# Patient Record
Sex: Male | Born: 1985 | Race: White | Hispanic: No | Marital: Married | Smoking: Never smoker
Health system: Southern US, Community
[De-identification: ages and names within clinical notes are randomized; demographics above are authoritative.]

## PROBLEM LIST (undated history)

## (undated) DIAGNOSIS — F32A Depression, unspecified: Secondary | ICD-10-CM

## (undated) DIAGNOSIS — J45909 Unspecified asthma, uncomplicated: Secondary | ICD-10-CM

## (undated) DIAGNOSIS — F329 Major depressive disorder, single episode, unspecified: Secondary | ICD-10-CM

## (undated) HISTORY — PX: OTHER SURGICAL HISTORY: SHX169

---

## 2018-05-30 ENCOUNTER — Other Ambulatory Visit: Payer: Self-pay

## 2018-05-30 DIAGNOSIS — Z0000 Encounter for general adult medical examination without abnormal findings: Secondary | ICD-10-CM

## 2018-05-31 LAB — URINALYSIS, ROUTINE W REFLEX MICROSCOPIC
Bilirubin, UA: NEGATIVE
GLUCOSE, UA: NEGATIVE
Ketones, UA: NEGATIVE
LEUKOCYTES UA: NEGATIVE
Nitrite, UA: NEGATIVE
PROTEIN UA: NEGATIVE
RBC, UA: NEGATIVE
Specific Gravity, UA: 1.027 (ref 1.005–1.030)
Urobilinogen, Ur: 0.2 mg/dL (ref 0.2–1.0)
pH, UA: 5.5 (ref 5.0–7.5)

## 2018-05-31 LAB — CBC WITH DIFFERENTIAL/PLATELET
BASOS: 1 %
Basophils Absolute: 0 10*3/uL (ref 0.0–0.2)
EOS (ABSOLUTE): 0.2 10*3/uL (ref 0.0–0.4)
EOS: 3 %
HEMATOCRIT: 43.3 % (ref 37.5–51.0)
HEMOGLOBIN: 15.3 g/dL (ref 13.0–17.7)
IMMATURE GRANS (ABS): 0 10*3/uL (ref 0.0–0.1)
IMMATURE GRANULOCYTES: 0 %
LYMPHS: 29 %
Lymphocytes Absolute: 1.6 10*3/uL (ref 0.7–3.1)
MCH: 31.2 pg (ref 26.6–33.0)
MCHC: 35.3 g/dL (ref 31.5–35.7)
MCV: 88 fL (ref 79–97)
MONOCYTES: 11 %
Monocytes Absolute: 0.6 10*3/uL (ref 0.1–0.9)
NEUTROS PCT: 56 %
Neutrophils Absolute: 3.2 10*3/uL (ref 1.4–7.0)
PLATELETS: 273 10*3/uL (ref 150–450)
RBC: 4.9 x10E6/uL (ref 4.14–5.80)
RDW: 12.7 % (ref 12.3–15.4)
WBC: 5.6 10*3/uL (ref 3.4–10.8)

## 2018-05-31 LAB — COMPREHENSIVE METABOLIC PANEL
A/G RATIO: 2.2 (ref 1.2–2.2)
ALBUMIN: 4.9 g/dL (ref 3.5–5.5)
ALT: 18 IU/L (ref 0–44)
AST: 17 IU/L (ref 0–40)
Alkaline Phosphatase: 46 IU/L (ref 39–117)
BUN / CREAT RATIO: 15 (ref 9–20)
BUN: 16 mg/dL (ref 6–20)
Bilirubin Total: 1.1 mg/dL (ref 0.0–1.2)
CHLORIDE: 99 mmol/L (ref 96–106)
CO2: 24 mmol/L (ref 20–29)
Calcium: 9.6 mg/dL (ref 8.7–10.2)
Creatinine, Ser: 1.05 mg/dL (ref 0.76–1.27)
GFR calc non Af Amer: 94 mL/min/{1.73_m2} (ref >59)
GFR, EST AFRICAN AMERICAN: 109 mL/min/{1.73_m2} (ref >59)
GLOBULIN, TOTAL: 2.2 g/dL (ref 1.5–4.5)
GLUCOSE: 98 mg/dL (ref 65–99)
Potassium: 4.3 mmol/L (ref 3.5–5.2)
Sodium: 139 mmol/L (ref 134–144)
TOTAL PROTEIN: 7.1 g/dL (ref 6.0–8.5)

## 2018-05-31 LAB — LIPID PANEL
Chol/HDL Ratio: 3.2 ratio (ref 0.0–5.0)
Cholesterol, Total: 172 mg/dL (ref 100–199)
HDL: 53 mg/dL (ref >39)
LDL Calculated: 95 mg/dL (ref 0–99)
Triglycerides: 120 mg/dL (ref 0–149)
VLDL CHOLESTEROL CAL: 24 mg/dL (ref 5–40)

## 2018-05-31 LAB — PSA: Prostate Specific Ag, Serum: 0.5 ng/mL (ref 0.0–4.0)

## 2018-06-22 ENCOUNTER — Encounter: Payer: Self-pay | Admitting: Adult Health

## 2018-06-22 ENCOUNTER — Ambulatory Visit: Payer: Self-pay | Admitting: Adult Health

## 2018-06-22 ENCOUNTER — Ambulatory Visit
Admission: RE | Admit: 2018-06-22 | Discharge: 2018-06-22 | Disposition: A | Discharge status: Home / Self Care | Payer: BLUE CROSS/BLUE SHIELD | Source: Ambulatory Visit | Attending: Adult Health | Admitting: Adult Health

## 2018-06-22 VITALS — BP 119/72 | HR 62 | Temp 98.4°F | Resp 16 | Ht 67.0 in | Wt 173.0 lb

## 2018-06-22 DIAGNOSIS — R509 Fever, unspecified: Secondary | ICD-10-CM | POA: Insufficient documentation

## 2018-06-22 DIAGNOSIS — R062 Wheezing: Secondary | ICD-10-CM

## 2018-06-22 DIAGNOSIS — J069 Acute upper respiratory infection, unspecified: Secondary | ICD-10-CM

## 2018-06-22 DIAGNOSIS — R05 Cough: Secondary | ICD-10-CM | POA: Insufficient documentation

## 2018-06-22 MED ORDER — ALBUTEROL SULFATE HFA 108 (90 BASE) MCG/ACT IN AERS: 2.0000 | INHALATION_SPRAY | Freq: Four times a day (QID) | RESPIRATORY_TRACT | 2 refills | Status: DC | PRN

## 2018-06-22 MED ORDER — BENZONATATE 100 MG PO CAPS: 100.0000 mg | ORAL_CAPSULE | Freq: Two times a day (BID) | ORAL | 0 refills | Status: DC | PRN

## 2018-06-22 MED ORDER — IPRATROPIUM-ALBUTEROL 0.5-2.5 (3) MG/3ML IN SOLN
3.0000 mL | Freq: Once | RESPIRATORY_TRACT | Status: AC
  Administered 2018-06-21: 3 mL via RESPIRATORY_TRACT

## 2018-06-22 MED ORDER — AMOXICILLIN-POT CLAVULANATE 875-125 MG PO TABS: 1.0000 | ORAL_TABLET | Freq: Two times a day (BID) | ORAL | 0 refills | Status: DC

## 2018-06-22 MED ORDER — PREDNISONE 10 MG (21) PO TBPK: ORAL_TABLET | ORAL | 0 refills | Status: DC

## 2018-06-22 NOTE — Progress Notes (Signed)
Subjective:     Patient ID: Jerry Santana, male   DOB: July 18, 1986, 32 y.o.   MRN: [id number]  Blood pressure 119/72, pulse 62, temperature 98.4 F (36.9 C), resp. rate 16, height 5\' 7"  (1.702 m), weight 173 lb (78.5 kg), SpO2 97 %.; Pulse ox 99 % after Duo- neb treatment.   HPI  Patient is a 32 year old male in no acute distress who comes to the clinic for cough, cold for 1.5 weeks.  He reports he was feeling better initially but started feeling worse. He reports past three nights fever, chills. Denies fever and chills last night. Mild body aches.  Reports non productive cough.  He reports he is still having "rattling"  in his chest and cough.  He has still been working still. Mild to moderate fatigue.  Denies any abdominal pain.   His father was sick with something similar about 2 weeks ago. Wife with cold.   80 month old  son in daycare has been sick on and off.   NKDA   Denies ever smoking, Denies vaping.  Patient  denies  rash, chest pain, shortness of breath, nausea, vomiting, or diarrhea.  History of syncope  6/37/19 - denies any since - was seen by cardiac - all was negative per patient.  Denies any palpitations.    Review of Systems  Constitutional: Positive for appetite change (decreased ), chills, fatigue and fever. Negative for activity change (still working and excercising " feels tired " ), diaphoresis and unexpected weight change.  HENT: Positive for congestion, postnasal drip, rhinorrhea and sore throat. Negative for dental problem, ear discharge, sinus pressure, sinus pain, sneezing, tinnitus, trouble swallowing and voice change.   Eyes: Negative.   Respiratory: Positive for cough, chest tightness and wheezing. Negative for apnea, choking, shortness of breath and stridor.   Cardiovascular: Negative for chest pain, palpitations and leg swelling.  Gastrointestinal: Negative.   Genitourinary: Negative.   Musculoskeletal: Positive for myalgias. Negative for  arthralgias, back pain, gait problem, joint swelling, neck pain and neck stiffness.  Skin: Negative.   Allergic/Immunologic: Negative.   Neurological: Negative.   Hematological: Negative.   Psychiatric/Behavioral: Negative.        Objective:   Physical Exam  Constitutional: He is oriented to person, place, and time. He appears well-developed and well-nourished. He is active.  Non-toxic appearance. He does not have a sickly appearance. No distress.  Patient is alert and oriented and responsive to questions Engages in eye contact with provider. Speaks in full sentences without any pauses without any shortness of breath or distress.  Pale skin   HENT:  Head: Normocephalic and atraumatic.  Right Ear: External ear normal. Tympanic membrane is not perforated and not erythematous. A middle ear effusion is present.  Left Ear: External ear normal. Tympanic membrane is not perforated and not erythematous. A middle ear effusion is present.  Nose: Mucosal edema and rhinorrhea present. Right sinus exhibits no maxillary sinus tenderness and no frontal sinus tenderness. Left sinus exhibits no maxillary sinus tenderness and no frontal sinus tenderness.  Mouth/Throat: Uvula is midline and mucous membranes are normal. Posterior oropharyngeal erythema present. No oropharyngeal exudate, posterior oropharyngeal edema or tonsillar abscesses. Tonsils are 1+ on the right. Tonsils are 1+ on the left. No tonsillar exudate.  Eyes: Pupils are equal, round, and reactive to light. Conjunctivae, EOM and lids are normal. Right eye exhibits no discharge. Left eye exhibits no discharge.  Neck: Normal range of motion. Neck supple. No JVD present. No  tracheal deviation present.  Cardiovascular: Normal rate, regular rhythm, normal heart sounds and intact distal pulses. Exam reveals no gallop and no friction rub.  No murmur heard. Pulmonary/Chest: Effort normal. No accessory muscle usage or stridor. No apnea, no tachypnea and no  bradypnea. No respiratory distress. He has no decreased breath sounds. He has wheezes in the right upper field and the left upper field. He has rhonchi in the right upper field and the left upper field. He has rales (scattered crackles ) in the left upper field and the left middle field.  Duoneb breathing treatment  administered by Armando Gangracy Greene RN - breath sounds improved after treatment, no rales heard and scattered  rhonchi much decreased and only mild expiratory wheeze after treatment.   Abdominal: Soft. Normal appearance and bowel sounds are normal. He exhibits no distension and no mass. There is no tenderness. There is no rebound and no guarding. No hernia.  Musculoskeletal: Normal range of motion. He exhibits no edema or tenderness.  Patient moves on and off of exam table and in room without difficulty. Gait is normal in hall and in room. Patient is oriented to person place time and situation. Patient answers questions appropriately and engages in conversation.   Lymphadenopathy:       Head (right side): No submental, no submandibular, no tonsillar, no preauricular, no posterior auricular and no occipital adenopathy present.       Head (left side): No submental, no submandibular, no tonsillar, no preauricular, no posterior auricular and no occipital adenopathy present.    He has no cervical adenopathy.  Neurological: He is alert and oriented to person, place, and time.  Skin: Skin is warm. Capillary refill takes less than 2 seconds. He is not diaphoretic. No erythema. No pallor.  Vitals reviewed.      Assessment:       Upper respiratory tract infection, unspecified type  Wheezing - Plan: ipratropium-albuterol (DUONEB) 0.5-2.5 (3) MG/3ML nebulizer solution 3 mL, DG Chest 2 View   Plan:        Meds ordered this encounter  Medications  . ipratropium-albuterol (DUONEB) 0.5-2.5 (3) MG/3ML nebulizer solution 3 mL  . albuterol (PROVENTIL HFA;VENTOLIN HFA) 108 (90 Base) MCG/ACT inhaler     Sig: Inhale 2 puffs into the lungs every 6 (six) hours as needed for wheezing or shortness of breath.    Dispense:  1 Inhaler    Refill:  2  . predniSONE (STERAPRED UNI-PAK 21 TAB) 10 MG (21) TBPK tablet    Sig: PO: Take 6 tablets on day 1:Take 5 tablets day 2:Take 4 tablets day 3: Take 3 tablets day 4:Take 2 tablets day five: 5 Take 1 tablet day 6    Dispense:  21 tablet    Refill:  0  . amoxicillin-clavulanate (AUGMENTIN) 875-125 MG tablet    Sig: Take 1 tablet by mouth 2 (two) times daily.    Dispense:  20 tablet    Refill:  0  . benzonatate (TESSALON) 100 MG capsule    Sig: Take 1 capsule (100 mg total) by mouth 2 (two) times daily as needed for cough. May cause drowsiness    Dispense:  20 capsule    Refill:  0   Reviewed After visit summary with patient. Reviewed if any respiratory distress Call 911 if symptoms worsen at anytime seek medical attention immediately.  Explained inhaler and use.  Would like chest x ray rule out pneumonia versus bronchitis- patient will go - will call if abnormal.  Patient  questions if he can still continue playing sports and running advised to rest and not to participate until one week recheck. Offered work note- patient declined.   Return in about 1 week (around 06/29/2018) for at any time for any worsening symptoms, Call 911 for emergencies.  Orders Placed This Encounter  Procedures  . DG Chest 2 View    Standing Status:   Future    Standing Expiration Date:   07/22/2018    Order Specific Question:   Reason for Exam (SYMPTOM  OR DIAGNOSIS REQUIRED)    Answer:   wheezing, rhonchi with scattered crackles /  left upper/ midddle lobe rule out pneumonia other etiology. cough fever chills.    Order Specific Question:   Preferred imaging location?    Answer:   [facility]    Order Specific Question:   Call Results- Best Contact Number?    Answer:   [phone]    Order Specific Question:   Radiology Contrast Protocol - do NOT remove file path     Answer:   [email or web address]     Advised patient call the office or your primary care doctor for an appointment if no improvement within 72 hours or if any symptoms change or worsen at any time  Advised ER or urgent Care if after hours or on weekend. Call 911 for emergency symptoms at any time.Patinet verbalized understanding of all instructions given/reviewed and treatment plan and has no further questions or concerns at this time.    Patient verbalized understanding of all instructions given and denies any further questions at this time.

## 2018-06-22 NOTE — Patient Instructions (Addendum)
Upper Respiratory Infection, Adult Most upper respiratory infections (URIs) are caused by a virus. A URI affects the nose, throat, and upper air passages. The most common type of URI is often called "the common cold." Follow these instructions at home:  Take medicines only as told by your doctor.  Gargle warm saltwater or take cough drops to comfort your throat as told by your doctor.  Use a warm mist humidifier or inhale steam from a shower to increase air moisture. This may make it easier to breathe.  Drink enough fluid to keep your pee (urine) clear or pale yellow.  Eat soups and other clear broths.  Have a healthy diet.  Rest as needed.  Go back to work when your fever is gone or your doctor says it is okay. ? You may need to stay home longer to avoid giving your URI to others. ? You can also wear a face mask and wash your hands often to prevent spread of the virus.  Use your inhaler more if you have asthma.  Do not use any tobacco products, including cigarettes, chewing tobacco, or electronic cigarettes. If you need help quitting, ask your doctor. Contact a doctor if:  You are getting worse, not better.  Your symptoms are not helped by medicine.  You have chills.  You are getting more short of breath.  You have brown or red mucus.  You have yellow or brown discharge from your nose.  You have pain in your face, especially when you bend forward.  You have a fever.  You have puffy (swollen) neck glands.  You have pain while swallowing.  You have white areas in the back of your throat. Get help right away if:  You have very bad or constant: ? Headache. ? Ear pain. ? Pain in your forehead, behind your eyes, and over your cheekbones (sinus pain). ? Chest pain.  You have long-lasting (chronic) lung disease and any of the following: ? Wheezing. ? Long-lasting cough. ? Coughing up blood. ? A change in your usual mucus.  You have a stiff neck.  You have  changes in your: ? Vision. ? Hearing. ? Thinking. ? Mood. This information is not intended to replace advice given to you by your health care provider. Make sure you discuss any questions you have with your health care provider. Document Released: 01/05/2008 Document Revised: 03/21/2016 Document Reviewed: 10/24/2013 Elsevier Interactive Patient Education  2018 [employer]. Bronchospasm, Adult Bronchospasm is a tightening of the airways going into the lungs. During an episode, it may be harder to breathe. You may cough, and you may make a whistling sound when you breathe (wheeze). This condition often affects people with asthma. What are the causes? This condition is caused by swelling and irritation in the airways. It can be triggered by:  An infection (common).  Seasonal allergies.  An allergic reaction.  Exercise.  Irritants. These include pollution, cigarette smoke, strong odors, aerosol sprays, and paint fumes.  Weather changes. Winds increase molds and pollens in the air. Cold air may cause swelling.  Stress and emotional upset.  What are the signs or symptoms? Symptoms of this condition include:  Wheezing. If the episode was triggered by an allergy, wheezing may start right away or hours later.  Nighttime coughing.  Frequent or severe coughing with a simple cold.  Chest tightness.  Shortness of breath.  Decreased ability to exercise.  How is this diagnosed? This condition is usually diagnosed with a review of your medical  history and a physical exam. Tests, such as lung function tests, are sometimes done to look for other conditions. The need for a chest X-ray depends on where the wheezing occurs and whether it is the first time you have wheezed. How is this treated? This condition may be treated with:  Inhaled medicines. These open up the airways and help you breathe. They can be taken with an inhaler or a nebulizer device.  Corticosteroid medicines. These  may be given for severe bronchospasm, usually when it is associated with asthma.  Avoiding triggers, such as irritants, infection, or allergies.  Follow these instructions at home: Medicines  Take over-the-counter and prescription medicines only as told by your health care provider.  If you need to use an inhaler or nebulizer to take your medicine, ask your health care provider to explain how to use it correctly. If you were given a spacer, always use it with your inhaler. Lifestyle  Reduce the number of triggers in your home. To do this: ? Change your heating and air conditioning filter at least once a month. ? Limit your use of fireplaces and wood stoves. ? Do not smoke. Do not allow smoking in your home. ? Avoid using perfumes and fragrances. ? Get rid of pests, such as roaches and mice, and their droppings. ? Remove any mold from your home. ? Keep your house clean and dust free. Use unscented cleaning products. ? Replace carpet with wood, tile, or vinyl flooring. Carpet can trap dander and dust. ? Use allergy-proof pillows, mattress covers, and box spring covers. ? Wash bed sheets and blankets every week in hot water. Dry them in a dryer. ? Use blankets that are made of polyester or cotton. ? Wash your hands often. ? Do not allow pets in your bedroom.  Avoid breathing in cold air when you exercise. General instructions  Have a plan for seeking medical care. Know when to call your health care provider and local emergency services, and where to get emergency care.  Stay up to date on your immunizations.  When you have an episode of bronchospasm, stay calm. Try to relax and breathe more slowly.  If you have asthma, make sure you have an asthma action plan.  Keep all follow-up visits as told by your health care provider. This is important. Contact a health care provider if:  You have muscle aches.  You have chest pain.  The mucus that you cough up (sputum) changes from  clear or white to yellow, green, gray, or bloody.  You have a fever.  Your sputum gets thicker. Get help right away if:  Your wheezing and coughing get worse, even after you take your prescribed medicines.  It gets even harder to breathe.  You develop severe chest pain. Summary  Bronchospasm is a tightening of the airways going into the lungs.  During an episode of bronchospasm, you may have a harder time breathing. You may cough and make a whistling sound when you breathe (wheeze).  Avoid exposure to triggers such as smoke, dust, mold, animal dander, and fragrances.  When you have an episode of bronchospasm, stay calm. Try to relax and breathe more slowly. This information is not intended to replace advice given to you by your health care provider. Make sure you discuss any questions you have with your health care provider. Document Released: 07/22/2003 Document Revised: 07/15/2016 Document Reviewed: 07/15/2016 Elsevier Interactive Patient Education  2017 [employer] Albuterol inhalation aerosol What is this medicine? ALBUTEROL (al  Gaspar Bidding) is a bronchodilator. It helps open up the airways in your lungs to make it easier to breathe. This medicine is used to treat and to prevent bronchospasm. This medicine may be used for other purposes; ask your health care provider or pharmacist if you have questions. COMMON BRAND NAME(S): Proair HFA, Proventil, Proventil HFA, Respirol, Ventolin, Ventolin HFA What should I tell my health care provider before I take this medicine? They need to know if you have any of the following conditions: -diabetes -heart disease or irregular heartbeat -high blood pressure -pheochromocytoma -seizures -thyroid disease -an unusual or allergic reaction to albuterol, levalbuterol, sulfites, other medicines, foods, dyes, or preservatives -pregnant or trying to get pregnant -breast-feeding How should I use this medicine? This medicine is for inhalation  through the mouth. Follow the directions on your prescription label. Take your medicine at regular intervals. Do not use more often than directed. Make sure that you are using your inhaler correctly. Ask you doctor or health care provider if you have any questions. Talk to your pediatrician regarding the use of this medicine in children. Special care may be needed. Overdosage: If you think you have taken too much of this medicine contact a poison control center or emergency room at once. NOTE: This medicine is only for you. Do not share this medicine with others. What if I miss a dose? If you miss a dose, use it as soon as you can. If it is almost time for your next dose, use only that dose. Do not use double or extra doses. What may interact with this medicine? -anti-infectives like chloroquine and pentamidine -caffeine -cisapride -diuretics -medicines for colds -medicines for depression or for emotional or psychotic conditions -medicines for weight loss including some herbal products -methadone -some antibiotics like clarithromycin, erythromycin, levofloxacin, and linezolid -some heart medicines -steroid hormones like dexamethasone, cortisone, hydrocortisone -theophylline -thyroid hormones This list may not describe all possible interactions. Give your health care provider a list of all the medicines, herbs, non-prescription drugs, or dietary supplements you use. Also tell them if you smoke, drink alcohol, or use illegal drugs. Some items may interact with your medicine. What should I watch for while using this medicine? Tell your doctor or health care professional if your symptoms do not improve. Do not use extra albuterol. If your asthma or bronchitis gets worse while you are using this medicine, call your doctor right away. If your mouth gets dry try chewing sugarless gum or sucking hard candy. Drink water as directed. What side effects may I notice from receiving this medicine? Side  effects that you should report to your doctor or health care professional as soon as possible: -allergic reactions like skin rash, itching or hives, swelling of the face, lips, or tongue -breathing problems -chest pain -feeling faint or lightheaded, falls -high blood pressure -irregular heartbeat -fever -muscle cramps or weakness -pain, tingling, numbness in the hands or feet -vomiting Side effects that usually do not require medical attention (report to your doctor or health care professional if they continue or are bothersome): -cough -difficulty sleeping -headache -nervousness or trembling -stomach upset -stuffy or runny nose -throat irritation -unusual taste This list may not describe all possible side effects. Call your doctor for medical advice about side effects. You may report side effects to FDA at 1-800-FDA-1088. Where should I keep my medicine? Keep out of the reach of children. Store at room temperature between 15 and 30 degrees C (59 and 86 degrees F). The contents  are under pressure and may burst when exposed to heat or flame. Do not freeze. This medicine does not work as well if it is too cold. Throw away any unused medicine after the expiration date. Inhalers need to be thrown away after the labeled number of puffs have been used or by the expiration date; whichever comes first. Ventolin HFA should be thrown away 12 months after removing from foil pouch. Check the instructions that come with your medicine. NOTE: This sheet is a summary. It may not cover all possible information. If you have questions about this medicine, talk to your doctor, pharmacist, or health care provider.  2018 Elsevier/Gold Standard (2013-01-04 10:57:17) Prednisolone tablets What is this medicine? PREDNISOLONE (pred NISS oh lone) is a corticosteroid. It is commonly used to treat inflammation of the skin, joints, lungs, and other organs. Common conditions treated include asthma, allergies, and  arthritis. It is also used for other conditions, such as blood disorders and diseases of the adrenal glands. This medicine may be used for other purposes; ask your health care provider or pharmacist if you have questions. COMMON BRAND NAME(S): Millipred, Millipred DP, Millipred DP 12-Day, Millipred DP 6 Day, Prednoral What should I tell my health care provider before I take this medicine? They need to know if you have any of these conditions: -Cushing's syndrome -diabetes -glaucoma -heart problems or disease -high blood pressure -infection such as herpes, measles, tuberculosis, or chickenpox -kidney disease -liver disease -mental problems -myasthenia gravis -osteoporosis -seizures -stomach ulcer or intestine disease including colitis and diverticulitis -thyroid problem -an unusual or allergic reaction to lactose, prednisolone, other medicines, foods, dyes, or preservatives -pregnant or trying to get pregnant -breast-feeding How should I use this medicine? Take this medicine by mouth with a glass of water. Follow the directions on the prescription label. Take it with food or milk to avoid stomach upset. If you are taking this medicine once a day, take it in the morning. Do not take more medicine than you are told to take. Do not suddenly stop taking your medicine because you may develop a severe reaction. Your doctor will tell you how much medicine to take. If your doctor wants you to stop the medicine, the dose may be slowly lowered over time to avoid any side effects. Talk to your pediatrician regarding the use of this medicine in children. Special care may be needed. Overdosage: If you think you have taken too much of this medicine contact a poison control center or emergency room at once. NOTE: This medicine is only for you. Do not share this medicine with others. What if I miss a dose? If you miss a dose, take it as soon as you can. If it is almost time for your next dose, take only  that dose. Do not take double or extra doses. What may interact with this medicine? Do not take this medicine with any of the following medications: -metyrapone -mifepristone This medicine may also interact with the following medications: -aminoglutethimide -amphotericin B -aspirin and aspirin-like medicines -barbiturates -certain medicines for diabetes, like glipizide or glyburide -cholestyramine -cholinesterase inhibitors -cyclosporine -digoxin -diuretics -ephedrine -male hormones, like estrogens and birth control pills -isoniazid -ketoconazole -NSAIDS, medicines for pain and inflammation, like ibuprofen or naproxen -phenytoin -rifampin -toxoids -vaccines -warfarin This list may not describe all possible interactions. Give your health care provider a list of all the medicines, herbs, non-prescription drugs, or dietary supplements you use. Also tell them if you smoke, drink alcohol, or use illegal drugs. Some  items may interact with your medicine. What should I watch for while using this medicine? Visit your doctor or health care professional for regular checks on your progress. If you are taking this medicine over a prolonged period, carry an identification card with your name and address, the type and dose of your medicine, and your doctor's name and address. This medicine may increase your risk of getting an infection. Tell your doctor or health care professional if you are around anyone with measles or chickenpox, or if you develop sores or blisters that do not heal properly. If you are going to have surgery, tell your doctor or health care professional that you have taken this medicine within the last twelve months. Ask your doctor or health care professional about your diet. You may need to lower the amount of salt you eat. This medicine may affect blood sugar levels. If you have diabetes, check with your doctor or health care professional before you change your diet or the  dose of your diabetic medicine. What side effects may I notice from receiving this medicine? Side effects that you should report to your doctor or health care professional as soon as possible: -allergic reactions like skin rash, itching or hives, swelling of the face, lips, or tongue -changes in emotions or moods -changes in vision -eye pain -signs and symptoms of high blood sugar such as dizziness; dry mouth; dry skin; fruity breath; nausea; stomach pain; increased hunger or thirst; increased urination -signs and symptoms of infection like fever or chills; cough; sore throat; pain or trouble passing urine -slow growth in children (if used for longer periods of time) -swelling of ankles, feet -trouble sleeping -unusually weak or tired -weak bones (if used for longer periods of time) Side effects that usually do not require medical attention (report to your doctor or health care professional if they continue or are bothersome): -increased hunger -nausea -skin problems, acne, thin and shiny skin -upset stomach -weight gain This list may not describe all possible side effects. Call your doctor for medical advice about side effects. You may report side effects to FDA at 1-800-FDA-1088. Where should I keep my medicine? Keep out of the reach of children. Store at room temperature between 15 and 30 degrees C (59 and 86 degrees F). Keep container tightly closed. Throw away any unused medicine after the expiration date. NOTE: This sheet is a summary. It may not cover all possible information. If you have questions about this medicine, talk to your doctor, pharmacist, or health care provider.  2018 Elsevier/Gold Standard (2015-08-21 12:30:30) Amoxicillin; Clavulanic Acid tablets What is this medicine? AMOXICILLIN; CLAVULANIC ACID (a mox i SIL in; KLAV yoo lan ic AS id) is a penicillin antibiotic. It is used to treat certain kinds of bacterial infections. It will not work for colds, flu, or other  viral infections. This medicine may be used for other purposes; ask your health care provider or pharmacist if you have questions. COMMON BRAND NAME(S): Augmentin What should I tell my health care provider before I take this medicine? They need to know if you have any of these conditions: -bowel disease, like colitis -kidney disease -liver disease -mononucleosis -an unusual or allergic reaction to amoxicillin, penicillin, cephalosporin, other antibiotics, clavulanic acid, other medicines, foods, dyes, or preservatives -pregnant or trying to get pregnant -breast-feeding How should I use this medicine? Take this medicine by mouth with a full glass of water. Follow the directions on the prescription label. Take at the start of a  meal. Do not crush or chew. If the tablet has a score line, you may cut it in half at the score line for easier swallowing. Take your medicine at regular intervals. Do not take your medicine more often than directed. Take all of your medicine as directed even if you think you are better. Do not skip doses or stop your medicine early. Talk to your pediatrician regarding the use of this medicine in children. Special care may be needed. Overdosage: If you think you have taken too much of this medicine contact a poison control center or emergency room at once. NOTE: This medicine is only for you. Do not share this medicine with others. What if I miss a dose? If you miss a dose, take it as soon as you can. If it is almost time for your next dose, take only that dose. Do not take double or extra doses. What may interact with this medicine? -allopurinol -anticoagulants -birth control pills -methotrexate -probenecid This list may not describe all possible interactions. Give your health care provider a list of all the medicines, herbs, non-prescription drugs, or dietary supplements you use. Also tell them if you smoke, drink alcohol, or use illegal drugs. Some items may interact  with your medicine. What should I watch for while using this medicine? Tell your doctor or health care professional if your symptoms do not improve. Do not treat diarrhea with over the counter products. Contact your doctor if you have diarrhea that lasts more than 2 days or if it is severe and watery. If you have diabetes, you may get a false-positive result for sugar in your urine. Check with your doctor or health care professional. Birth control pills may not work properly while you are taking this medicine. Talk to your doctor about using an extra method of birth control. What side effects may I notice from receiving this medicine? Side effects that you should report to your doctor or health care professional as soon as possible: -allergic reactions like skin rash, itching or hives, swelling of the face, lips, or tongue -breathing problems -dark urine -fever or chills, sore throat -redness, blistering, peeling or loosening of the skin, including inside the mouth -seizures -trouble passing urine or change in the amount of urine -unusual bleeding, bruising -unusually weak or tired -white patches or sores in the mouth or throat Side effects that usually do not require medical attention (report to your doctor or health care professional if they continue or are bothersome): -diarrhea -dizziness -headache -nausea, vomiting -stomach upset -vaginal or anal irritation This list may not describe all possible side effects. Call your doctor for medical advice about side effects. You may report side effects to FDA at 1-800-FDA-1088. Where should I keep my medicine? Keep out of the reach of children. Store at room temperature below 25 degrees C (77 degrees F). Keep container tightly closed. Throw away any unused medicine after the expiration date. NOTE: This sheet is a summary. It may not cover all possible information. If you have questions about this medicine, talk to your doctor, pharmacist, or  health care provider.  2018 Elsevier/Gold Standard (2007-10-12 12:04:30)

## 2018-09-01 ENCOUNTER — Other Ambulatory Visit: Payer: Self-pay | Admitting: Pulmonary Disease

## 2018-09-01 DIAGNOSIS — R42 Dizziness and giddiness: Secondary | ICD-10-CM

## 2018-09-01 DIAGNOSIS — R0609 Other forms of dyspnea: Principal | ICD-10-CM

## 2018-09-14 ENCOUNTER — Ambulatory Visit: Payer: BLUE CROSS/BLUE SHIELD | Attending: Pulmonary Disease

## 2018-09-21 ENCOUNTER — Ambulatory Visit: Payer: BLUE CROSS/BLUE SHIELD

## 2018-09-26 ENCOUNTER — Ambulatory Visit: Payer: BLUE CROSS/BLUE SHIELD

## 2018-10-10 ENCOUNTER — Ambulatory Visit: Payer: BLUE CROSS/BLUE SHIELD | Attending: Pulmonary Disease

## 2018-10-10 ENCOUNTER — Other Ambulatory Visit: Payer: Self-pay

## 2018-10-10 DIAGNOSIS — R0609 Other forms of dyspnea: Secondary | ICD-10-CM | POA: Diagnosis present

## 2018-10-10 DIAGNOSIS — R42 Dizziness and giddiness: Secondary | ICD-10-CM | POA: Insufficient documentation

## 2018-10-10 MED ORDER — METHACHOLINE 16 MG/ML NEB SOLN
2.0000 mL | Freq: Once | RESPIRATORY_TRACT | Status: AC
  Administered 2018-10-10: 32 mg via RESPIRATORY_TRACT
  Filled 2018-10-10: qty 2

## 2018-10-10 MED ORDER — METHACHOLINE 4 MG/ML NEB SOLN
2.0000 mL | Freq: Once | RESPIRATORY_TRACT | Status: AC
  Administered 2018-10-10: 8 mg via RESPIRATORY_TRACT
  Filled 2018-10-10: qty 2

## 2018-10-10 MED ORDER — METHACHOLINE 0.0625 MG/ML NEB SOLN
2.0000 mL | Freq: Once | RESPIRATORY_TRACT | Status: AC
  Administered 2018-10-10: 0.125 mg via RESPIRATORY_TRACT
  Filled 2018-10-10: qty 2

## 2018-10-10 MED ORDER — METHACHOLINE 0.25 MG/ML NEB SOLN
2.0000 mL | Freq: Once | RESPIRATORY_TRACT | Status: AC
  Administered 2018-10-10: 0.5 mg via RESPIRATORY_TRACT
  Filled 2018-10-10: qty 2

## 2018-10-10 MED ORDER — METHACHOLINE 1 MG/ML NEB SOLN
2.0000 mL | Freq: Once | RESPIRATORY_TRACT | Status: AC
  Administered 2018-10-10: 2 mg via RESPIRATORY_TRACT
  Filled 2018-10-10: qty 2

## 2018-10-10 MED ORDER — SODIUM CHLORIDE 0.9 % IN NEBU
3.0000 mL | INHALATION_SOLUTION | Freq: Once | RESPIRATORY_TRACT | Status: AC
  Administered 2018-10-10: 3 mL via RESPIRATORY_TRACT

## 2018-10-10 MED ORDER — ALBUTEROL SULFATE (2.5 MG/3ML) 0.083% IN NEBU
2.5000 mg | INHALATION_SOLUTION | Freq: Once | RESPIRATORY_TRACT | Status: AC
  Administered 2018-10-10: 2.5 mg via RESPIRATORY_TRACT
  Filled 2018-10-10: qty 3

## 2018-11-28 ENCOUNTER — Emergency Department: Payer: BLUE CROSS/BLUE SHIELD

## 2018-11-28 ENCOUNTER — Encounter: Payer: Self-pay | Admitting: Emergency Medicine

## 2018-11-28 ENCOUNTER — Observation Stay
Admission: EM | Admit: 2018-11-28 | Discharge: 2018-11-29 | Disposition: A | Discharge status: Home / Self Care | Payer: BLUE CROSS/BLUE SHIELD | Attending: Internal Medicine | Admitting: Internal Medicine

## 2018-11-28 ENCOUNTER — Other Ambulatory Visit: Payer: Self-pay

## 2018-11-28 DIAGNOSIS — F329 Major depressive disorder, single episode, unspecified: Secondary | ICD-10-CM | POA: Insufficient documentation

## 2018-11-28 DIAGNOSIS — N179 Acute kidney failure, unspecified: Secondary | ICD-10-CM | POA: Insufficient documentation

## 2018-11-28 DIAGNOSIS — J4520 Mild intermittent asthma, uncomplicated: Secondary | ICD-10-CM | POA: Insufficient documentation

## 2018-11-28 DIAGNOSIS — R55 Syncope and collapse: Principal | ICD-10-CM | POA: Insufficient documentation

## 2018-11-28 DIAGNOSIS — Z7951 Long term (current) use of inhaled steroids: Secondary | ICD-10-CM | POA: Diagnosis not present

## 2018-11-28 DIAGNOSIS — E860 Dehydration: Secondary | ICD-10-CM | POA: Diagnosis not present

## 2018-11-28 DIAGNOSIS — E872 Acidosis: Secondary | ICD-10-CM | POA: Insufficient documentation

## 2018-11-28 DIAGNOSIS — Z79899 Other long term (current) drug therapy: Secondary | ICD-10-CM | POA: Diagnosis not present

## 2018-11-28 DIAGNOSIS — R0602 Shortness of breath: Secondary | ICD-10-CM | POA: Diagnosis present

## 2018-11-28 DIAGNOSIS — E876 Hypokalemia: Secondary | ICD-10-CM | POA: Diagnosis not present

## 2018-11-28 HISTORY — DX: Major depressive disorder, single episode, unspecified: F32.9

## 2018-11-28 HISTORY — DX: Unspecified asthma, uncomplicated: J45.909

## 2018-11-28 HISTORY — DX: Depression, unspecified: F32.A

## 2018-11-28 LAB — ETHANOL: Alcohol, Ethyl (B): <10 mg/dL (ref <10)

## 2018-11-28 LAB — COMPREHENSIVE METABOLIC PANEL
ALT: 22 U/L (ref 0–44)
AST: 24 U/L (ref 15–41)
Albumin: 4.8 g/dL (ref 3.5–5.0)
Alkaline Phosphatase: 42 U/L (ref 38–126)
Anion gap: 14 (ref 5–15)
BUN: 19 mg/dL (ref 6–20)
CO2: 23 mmol/L (ref 22–32)
Calcium: 8.9 mg/dL (ref 8.9–10.3)
Chloride: 102 mmol/L (ref 98–111)
Creatinine, Ser: 1.4 mg/dL — ABNORMAL HIGH (ref 0.61–1.24)
GFR calc Af Amer: >60 mL/min (ref >60)
GFR calc non Af Amer: >60 mL/min (ref >60)
Glucose, Bld: 248 mg/dL — ABNORMAL HIGH (ref 70–99)
Potassium: 3.4 mmol/L — ABNORMAL LOW (ref 3.5–5.1)
Sodium: 139 mmol/L (ref 135–145)
Total Bilirubin: 1.1 mg/dL (ref 0.3–1.2)
Total Protein: 7.1 g/dL (ref 6.5–8.1)

## 2018-11-28 LAB — TROPONIN I: Troponin I: <0.03 ng/mL (ref <0.03)

## 2018-11-28 LAB — CBC WITH DIFFERENTIAL/PLATELET
Abs Immature Granulocytes: 0.08 10*3/uL — ABNORMAL HIGH (ref 0.00–0.07)
Basophils Absolute: 0 10*3/uL (ref 0.0–0.1)
Basophils Relative: 0 %
Eosinophils Absolute: 0 10*3/uL (ref 0.0–0.5)
Eosinophils Relative: 0 %
HCT: 43.6 % (ref 39.0–52.0)
Hemoglobin: 15.1 g/dL (ref 13.0–17.0)
Immature Granulocytes: 1 %
Lymphocytes Relative: 53 %
Lymphs Abs: 4 10*3/uL (ref 0.7–4.0)
MCH: 29.8 pg (ref 26.0–34.0)
MCHC: 34.6 g/dL (ref 30.0–36.0)
MCV: 86.2 fL (ref 80.0–100.0)
Monocytes Absolute: 0.4 10*3/uL (ref 0.1–1.0)
Monocytes Relative: 5 %
Neutro Abs: 3.1 10*3/uL (ref 1.7–7.7)
Neutrophils Relative %: 41 %
Platelets: 281 10*3/uL (ref 150–400)
RBC: 5.06 MIL/uL (ref 4.22–5.81)
RDW: 11.6 % (ref 11.5–15.5)
WBC: 7.6 10*3/uL (ref 4.0–10.5)
nRBC: 0 % (ref 0.0–0.2)

## 2018-11-28 LAB — LACTIC ACID, PLASMA: Lactic Acid, Venous: 2.8 mmol/L (ref 0.5–1.9)

## 2018-11-28 LAB — SALICYLATE LEVEL: Salicylate Lvl: <7 mg/dL (ref 2.8–30.0)

## 2018-11-28 LAB — ACETAMINOPHEN LEVEL: Acetaminophen (Tylenol), Serum: <10 ug/mL — ABNORMAL LOW (ref 10–30)

## 2018-11-28 MED ORDER — ONDANSETRON HCL 4 MG/2ML IJ SOLN
4.0000 mg | Freq: Once | INTRAMUSCULAR | Status: AC
  Administered 2018-11-28: 22:00:00 4 mg via INTRAVENOUS

## 2018-11-28 MED ORDER — SODIUM CHLORIDE 0.9 % IV BOLUS
500.0000 mL | Freq: Once | INTRAVENOUS | Status: AC
  Administered 2018-11-28: 500 mL via INTRAVENOUS

## 2018-11-28 MED ORDER — SODIUM CHLORIDE 0.9 % IV BOLUS
500.0000 mL | Freq: Once | INTRAVENOUS | Status: AC
  Administered 2018-11-29: 500 mL via INTRAVENOUS

## 2018-11-28 MED ORDER — ONDANSETRON HCL 4 MG/2ML IJ SOLN
INTRAMUSCULAR | Status: AC
  Administered 2018-11-28: 22:00:00 4 mg via INTRAVENOUS
  Filled 2018-11-28: qty 2

## 2018-11-28 NOTE — ED Notes (Signed)
Return from ct scan  Pt alert.

## 2018-11-28 NOTE — ED Notes (Signed)
Pt unable to void at this time.  Pt awake and talking   nsr on monitor.  Pt denies pain.  Iv fluids infusing.

## 2018-11-28 NOTE — ED Triage Notes (Signed)
PT to ED via EMS from home, was called out for unresponsive, per EMS pt was unresponsive on arrival and EMS began bagging. PT was given 2mg  of narcan with improvement. PT is alert on arrival, MD at bedside

## 2018-11-28 NOTE — H&P (Signed)
[facility] [place] [facility]   PATIENT NAME: Jerry Santana    MR#:  [id number]  DATE OF BIRTH:  1986/03/11  DATE OF ADMISSION:  11/28/2018  PRIMARY CARE PHYSICIAN: Jerl Mina, MD   REQUESTING/REFERRING PHYSICIAN: Ileana Roup, MD  CHIEF COMPLAINT:   Chief Complaint  Patient presents with  . Loss of Consciousness    HISTORY OF PRESENT ILLNESS:  Jerry Santana  is a 33 y.o. male with a known history of asthma and depression who presented to the ED with a syncopal episode at home this evening.  He states he was eating with his wife and he went to stand up, when he suddenly passed out.  At the time, his heart rate was in the 170s and his O2 saturations were in the 30s.  His wife called EMS.  Patient does not remember any of this, but he does remember waking up in the ambulance.  He states this same thing happened about a year ago.  He was running when he suddenly passed out and fell face first onto the pavement.  This occurred in [place].  He was hospitalized at the time and had a bunch of tests that all came back negative.  He wore a heart monitor for about 30 days and it did not show any arrhythmias.  He did say he had an episode where he felt like he was going to pass out, but the heart rate monitor slipped out of place and did not catch his heart rhythm at that time.  He is typically very active and runs 5 miles multiple times a week.  He denies any chest pain or shortness of breath.  He does state that he has episodes of irregular heartbeats every couple of months.  In the ED, vitals were unremarkable.  Labs were significant for K3.4, creatinine 1.40, lactic acid 2.8.  Chest x-ray was negative.  CT head was negative.  Hospitalists were called for admission.  PAST MEDICAL HISTORY:   Past Medical History:  Diagnosis Date  . Asthma   . Depression     PAST SURGICAL HISTORY:  History reviewed. No pertinent surgical history.  SOCIAL HISTORY:    Social History   Tobacco Use  . Smoking status: Never Smoker  . Smokeless tobacco: Never Used  Substance Use Topics  . Alcohol use: Yes    FAMILY HISTORY:  Father-high blood pressure DRUG ALLERGIES:  No Known Allergies  REVIEW OF SYSTEMS:   Review of Systems  Constitutional: Negative for chills and fever.  HENT: Negative for congestion.   Eyes: Negative for blurred vision and double vision.  Respiratory: Negative for cough and shortness of breath.   Cardiovascular: Positive for palpitations. Negative for chest pain and leg swelling.  Gastrointestinal: Negative for nausea and vomiting.  Genitourinary: Negative for dysuria and urgency.  Musculoskeletal: Negative for back pain and neck pain.  Neurological: Positive for loss of consciousness. Negative for dizziness and headaches.  Psychiatric/Behavioral: Negative for depression. The patient is not nervous/anxious.     MEDICATIONS AT HOME:   Prior to Admission medications   Medication Sig Start Date End Date Taking? Authorizing Provider  albuterol (PROVENTIL HFA;VENTOLIN HFA) 108 (90 Base) MCG/ACT inhaler Inhale 2 puffs into the lungs every 6 (six) hours as needed for wheezing or shortness of breath. 06/22/18   Flinchum, Eula Fried, FNP  amoxicillin-clavulanate (AUGMENTIN) 875-125 MG tablet Take 1 tablet by mouth 2 (two) times daily. 06/22/18   Flinchum, Eula Fried, FNP  benzonatate (  TESSALON) 100 MG capsule Take 1 capsule (100 mg total) by mouth 2 (two) times daily as needed for cough. May cause drowsiness 06/22/18   Flinchum, Eula Fried, FNP  finasteride (PROSCAR) 5 MG tablet Take by mouth.    [provider]  predniSONE (STERAPRED UNI-PAK 21 TAB) 10 MG (21) TBPK tablet PO: Take 6 tablets on day 1:Take 5 tablets day 2:Take 4 tablets day 3: Take 3 tablets day 4:Take 2 tablets day five: 5 Take 1 tablet day 6 06/22/18   Flinchum, Eula Fried, FNP      VITAL SIGNS:  Blood pressure (!) 101/58, pulse 73, temperature 97.7 F  (36.5 C), temperature source Oral, resp. rate 11, SpO2 100 %.  PHYSICAL EXAMINATION:  Physical Exam  GENERAL:  33 y.o.-year-old patient lying in the bed with no acute distress.  EYES: Pupils equal, round, reactive to light and accommodation. No scleral icterus. Extraocular muscles intact.  HEENT: Head atraumatic, normocephalic. Oropharynx and nasopharynx clear. + Dry mucous membranes. NECK:  Supple, no jugular venous distention. No thyroid enlargement, no tenderness.  LUNGS: Normal breath sounds bilaterally, no wheezing, rales,rhonchi or crepitation. No use of accessory muscles of respiration.  CARDIOVASCULAR: RRR, S1, S2 normal. No murmurs, rubs, or gallops.  ABDOMEN: Soft, nontender, nondistended. Bowel sounds present. No organomegaly or mass.  EXTREMITIES: No pedal edema, cyanosis, or clubbing.  NEUROLOGIC: Cranial nerves II through XII are intact. Muscle strength 5/5 in all extremities. Sensation intact. Gait not checked.  PSYCHIATRIC: The patient is alert and oriented x 3.  SKIN: No obvious rash, lesion, or ulcer.   LABORATORY PANEL:   CBC Recent Labs  Lab 11/28/18 2153  WBC 7.6  HGB 15.1  HCT 43.6  PLT 281   ------------------------------------------------------------------------------------------------------------------  Chemistries  Recent Labs  Lab 11/28/18 2153  NA 139  K 3.4*  CL 102  CO2 23  GLUCOSE 248*  BUN 19  CREATININE 1.40*  CALCIUM 8.9  AST 24  ALT 22  ALKPHOS 42  BILITOT 1.1   ------------------------------------------------------------------------------------------------------------------  Cardiac Enzymes Recent Labs  Lab 11/28/18 2153  TROPONINI <0.03   ------------------------------------------------------------------------------------------------------------------  RADIOLOGY:  Ct Head Wo Contrast  Result Date: 11/28/2018 CLINICAL DATA:  Initial evaluation for acute altered mental status, unresponsiveness. EXAM: CT HEAD WITHOUT  CONTRAST TECHNIQUE: Contiguous axial images were obtained from the base of the skull through the vertex without intravenous contrast. COMPARISON:  None available. FINDINGS: Brain: Cerebral volume within normal limits for patient age. No evidence for acute intracranial hemorrhage. No findings to suggest acute large vessel territory infarct. No mass lesion, midline shift, or mass effect. Ventricles are normal in size without evidence for hydrocephalus. No extra-axial fluid collection identified. Vascular: No hyperdense vessel identified. Skull: Scalp soft tissues demonstrate no acute abnormality. Calvarium intact. Sinuses/Orbits: Globes and orbital soft tissues within normal limits. Visualized paranasal sinuses are clear. No mastoid effusion. IMPRESSION: Negative head CT.  No acute intracranial abnormality identified. Electronically Signed   By: Rise Mu M.D.   On: 11/28/2018 23:14   Dg Chest Port 1 View  Result Date: 11/28/2018 CLINICAL DATA:  33 year old male is unresponsive. EXAM: PORTABLE CHEST 1 VIEW COMPARISON:  06/22/2018. FINDINGS: Portable AP upright view at 2157 hours. Low lung volumes. Normal cardiac size and mediastinal contours. Visualized tracheal air column is within normal limits. Allowing for portable technique the lungs are clear. No pneumothorax or pleural effusion. Paucity bowel gas in the upper abdomen. Negative visible osseous structures. IMPRESSION: Low lung volumes.  No acute cardiopulmonary abnormality. Electronically Signed  By: Odessa FlemingH  Hall M.D.   On: 11/28/2018 22:28      IMPRESSION AND PLAN:   Syncopal episode- unclear etiology.  May be cardiac, as patient endorses some episodes of palpitations.  No seizure-like activity noted.  Troponin negative. -Check EEG and MRI brain -Echo -Cardiac monitoring -Check UDS -IV fluids -Check orthostatic vitals in the morning -Cardiology consult -May need a 30-day event monitor  Lactic acidosis- likely due to dehydration. -IV  fluids -Recheck lactic acid  Acute kidney injury-likely due to dehydration -IV fluids -Recheck creatinine tomorrow  Hypokalemia -Replete and recheck  Chronic asthma- stable, no signs of acute exacerbation -Continue home inhalers  Depression-stable -Continue home Lexapro  All the records are reviewed and case discussed with ED provider. Management plans discussed with the patient, family and they are in agreement.  CODE STATUS: Full  TOTAL TIME TAKING CARE OF THIS PATIENT: 45 minutes.    Jinny BlossomKaty D Mayo M.D on 11/28/2018 at 11:36 PM  Between 7am to 6pm - Pager - [phone]  After 6pm go to [email or web address] - Social research officer, governmentpassword [email or web address]  [facility] [place] [facility]  Office  [phone]  CC: Primary care physician; Jerl MinaHedrick, James, MD   Note: This dictation was prepared with Dragon dictation along with smaller phrase technology. Any transcriptional errors that result from this process are unintentional.

## 2018-11-28 NOTE — ED Provider Notes (Addendum)
[facility] Emergency Department Provider Note  ____________________________________________   I have reviewed the triage vital signs and the nursing notes. Where available I have reviewed prior notes and, if possible and indicated, outside hospital notes.    HISTORY  Chief Complaint Loss of Consciousness    HPI Jerry Santana is a 33 y.o. male  History of asthma and depression, presents today after passing out at home.  According to his EMS providers, he was pinpoint and apneic and they gave him Narcan with immediate response.  He gave a total of 6 mg of Narcan and he is awake and alert at this time.  Patient denies any recreational or other drug use.  He denies any allergies.  He states he just felt bad after dinner.  We did talk to his wife and she states similar.  That he seemed to feel bad went upstairs and she found him upstairs minimally responsive.  Patient has a history of significant anxiety and has been suffering from insomnia recently from prior notes.  His history at this time is somewhat limited he can speak to me in [place] and in [place] and he is oriented, he denies any complaints right now except for he "feels better". No seizure history no tongue bite no incontinence of bowel or bladder. Wife states this is the third time this is happened to him.  Did report that he was in sinus tach with low sats when they arrived but again all that responded after Narcan and supplemental oxygen  Past Medical History:  Diagnosis Date  . Asthma   . Depression     There are no active problems to display for this patient.   History reviewed. No pertinent surgical history.  Prior to Admission medications   Medication Sig Start Date End Date Taking? Authorizing Provider  albuterol (PROVENTIL HFA;VENTOLIN HFA) 108 (90 Base) MCG/ACT inhaler Inhale 2 puffs into the lungs every 6 (six) hours as needed for wheezing or shortness of breath. 06/22/18   Flinchum,  Eula Fried, FNP  amoxicillin-clavulanate (AUGMENTIN) 875-125 MG tablet Take 1 tablet by mouth 2 (two) times daily. 06/22/18   Flinchum, Eula Fried, FNP  benzonatate (TESSALON) 100 MG capsule Take 1 capsule (100 mg total) by mouth 2 (two) times daily as needed for cough. May cause drowsiness 06/22/18   Flinchum, Eula Fried, FNP  finasteride (PROSCAR) 5 MG tablet Take by mouth.    [provider]  predniSONE (STERAPRED UNI-PAK 21 TAB) 10 MG (21) TBPK tablet PO: Take 6 tablets on day 1:Take 5 tablets day 2:Take 4 tablets day 3: Take 3 tablets day 4:Take 2 tablets day five: 5 Take 1 tablet day 6 06/22/18   Flinchum, Eula Fried, FNP    Allergies Patient has no known allergies.  No family history on file.  Social History Social History   Tobacco Use  . Smoking status: Never Smoker  . Smokeless tobacco: Never Used  Substance Use Topics  . Alcohol use: Yes  . Drug use: Never    Review of Systems Constitutional: No fever/chills Eyes: No visual changes. ENT: No sore throat. No stiff neck no neck pain Cardiovascular: Denies chest pain. Respiratory: Denies shortness of breath. Gastrointestinal:   no vomiting.  No diarrhea.  No constipation. Genitourinary: Negative for dysuria. Musculoskeletal: Negative lower extremity swelling Skin: Negative for rash. Neurological: Negative for severe headaches, focal weakness or numbness.   ____________________________________________   PHYSICAL EXAM:  VITAL SIGNS: ED Triage Vitals  Enc Vitals Group  BP 11/28/18 2149 117/69     Pulse Rate 11/28/18 2149 (!) 137     Resp 11/28/18 2148 (!) 26     Temp --      Temp src --      SpO2 11/28/18 2148 95 %     Weight --      Height --      Head Circumference --      Peak Flow --      Pain Score --      Pain Loc --      Pain Edu? --      Excl. in GC? --     Constitutional: Alert and oriented very anxious but in no acute medical distress.  Eyes: Conjunctivae are normal Head:  Atraumatic HEENT: No congestion/rhinnorhea. Mucous membranes are moist.  Oropharynx non-erythematous Neck:   Nontender with no meningismus, no masses, no stridor Cardiovascular: Normal rate, regular rhythm. Grossly normal heart sounds.  Good peripheral circulation. Respiratory: Normal respiratory effort.  No retractions. Lungs CTAB.  He is breathing quickly, but his lungs are clear and when I couch him he is breathing much more calmly. Abdominal: Soft and nontender. No distention. No guarding no rebound Back:  There is no focal tenderness or step off.  there is no midline tenderness there are no lesions noted. there is no CVA tenderness Musculoskeletal: No lower extremity tenderness, no upper extremity tenderness. No joint effusions, no DVT signs strong distal pulses no edema Neurologic:  Normal speech and language. No gross focal neurologic deficits are appreciated.  Is at this time equally round reactive to light extraocular muscles intact, appears oriented.  Sensation is intact.  Somewhat limited exam but no focal numbness or weakness.  Patient initially seemed to have some spasmodic motions generally, not focally and not simultaneously and not with any stated change in consciousness.  He is not suffering from any evidence of cogwheel rigidity or hyperreflexia that he Skin:  Skin is warm, dry and intact. No rash noted. Psychiatric: Mood and affect are somewhat unusual very anxious. Speech is normal  ____________________________________________   LABS (all labs ordered are listed, but only abnormal results are displayed)  Labs Reviewed  BLOOD GAS, VENOUS  COMPREHENSIVE METABOLIC PANEL  CBC WITH DIFFERENTIAL/PLATELET  LACTIC ACID, PLASMA  LACTIC ACID, PLASMA  TROPONIN I  URINALYSIS, COMPLETE (UACMP) WITH MICROSCOPIC  URINE DRUG SCREEN, QUALITATIVE ([facility] ONLY)  ETHANOL  ACETAMINOPHEN LEVEL  SALICYLATE LEVEL    Pertinent labs  results that were available during my care of the patient  were reviewed by me and considered in my medical decision making (see chart for details). ____________________________________________  EKG  I personally interpreted any EKGs ordered by me or triage Sinus rhythm, rate 75 bpm normal axis no acute ST elevation or depression ____________________________________________  RADIOLOGY  Pertinent labs & imaging results that were available during my care of the patient were reviewed by me and considered in my medical decision making (see chart for details). If possible, patient and/or family made aware of any abnormal findings.  No results found. ____________________________________________    PROCEDURES  Procedure(s) performed: None  Procedures  Critical Care performed: None  ____________________________________________   INITIAL IMPRESSION / ASSESSMENT AND PLAN / ED COURSE  Pertinent labs & imaging results that were available during my care of the patient were reviewed by me and considered in my medical decision making (see chart for details).  Clear exactly what brought the patient in today, clinically speaking, patient for EMS appear  to have overdosed and he responded very well to Narcan.  He does not appear to have serotonin syndrome although that is on the differential does not appear to have had seizures all events on the differential, there is no evidence of ongoing cardiac dysrhythmia, he is very anxious and upset after Narcan.  He is nonfocal in his neurologic exam.  We are going to institute a broad work-up and we will keep him monitored here I will start with a chest x-ray.  No evidence of trauma he may require a CT scan of the head for his altered mental status earlier but, I will hold off on that till I know he is stable to go to CT.  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  We will I think obtain CT at this time to further evaluate him for either traumatic or epilectogenic pathology    ____________________________________________   FINAL CLINICAL IMPRESSION(S) / ED DIAGNOSES  Final diagnoses:  SOB (shortness of breath)      This chart was dictated using voice recognition software.  Despite best efforts to proofread,  errors can occur which can change meaning.      Jeanmarie Plant, MD 11/28/18 2221    Jeanmarie Plant, MD 11/28/18 2240

## 2018-11-29 ENCOUNTER — Observation Stay: Payer: BLUE CROSS/BLUE SHIELD

## 2018-11-29 ENCOUNTER — Observation Stay
Admit: 2018-11-29 | Discharge: 2018-11-29 | Disposition: A | Discharge status: Home / Self Care | Payer: BLUE CROSS/BLUE SHIELD | Attending: Internal Medicine | Admitting: Internal Medicine

## 2018-11-29 ENCOUNTER — Other Ambulatory Visit: Payer: BLUE CROSS/BLUE SHIELD

## 2018-11-29 DIAGNOSIS — R55 Syncope and collapse: Secondary | ICD-10-CM | POA: Diagnosis not present

## 2018-11-29 LAB — LACTIC ACID, PLASMA: Lactic Acid, Venous: 1.7 mmol/L (ref 0.5–1.9)

## 2018-11-29 LAB — URINALYSIS, COMPLETE (UACMP) WITH MICROSCOPIC
Bacteria, UA: NONE SEEN
Bilirubin Urine: NEGATIVE
Glucose, UA: >=500 mg/dL — AB
Hgb urine dipstick: NEGATIVE
Ketones, ur: NEGATIVE mg/dL
Leukocytes,Ua: NEGATIVE
Nitrite: NEGATIVE
Protein, ur: NEGATIVE mg/dL
Specific Gravity, Urine: 1.015 (ref 1.005–1.030)
Squamous Epithelial / HPF: NONE SEEN (ref 0–5)
pH: 5 (ref 5.0–8.0)

## 2018-11-29 LAB — ECHOCARDIOGRAM COMPLETE
Height: 67 in
Weight: 2926.4 oz

## 2018-11-29 LAB — URINE DRUG SCREEN, QUALITATIVE (ARMC ONLY)
Amphetamines, Ur Screen: NOT DETECTED
Barbiturates, Ur Screen: NOT DETECTED
Benzodiazepine, Ur Scrn: NOT DETECTED
Cannabinoid 50 Ng, Ur ~~LOC~~: NOT DETECTED
Cocaine Metabolite,Ur ~~LOC~~: NOT DETECTED
MDMA (Ecstasy)Ur Screen: NOT DETECTED
Methadone Scn, Ur: NOT DETECTED
Opiate, Ur Screen: NOT DETECTED
Phencyclidine (PCP) Ur S: NOT DETECTED
Tricyclic, Ur Screen: NOT DETECTED

## 2018-11-29 LAB — CBC
HCT: 39.3 % (ref 39.0–52.0)
Hemoglobin: 13.8 g/dL (ref 13.0–17.0)
MCH: 30.1 pg (ref 26.0–34.0)
MCHC: 35.1 g/dL (ref 30.0–36.0)
MCV: 85.6 fL (ref 80.0–100.0)
Platelets: 229 10*3/uL (ref 150–400)
RBC: 4.59 MIL/uL (ref 4.22–5.81)
RDW: 11.8 % (ref 11.5–15.5)
WBC: 16.2 10*3/uL — ABNORMAL HIGH (ref 4.0–10.5)
nRBC: 0 % (ref 0.0–0.2)

## 2018-11-29 LAB — BASIC METABOLIC PANEL
Anion gap: 6 (ref 5–15)
BUN: 17 mg/dL (ref 6–20)
CO2: 26 mmol/L (ref 22–32)
Calcium: 8.6 mg/dL — ABNORMAL LOW (ref 8.9–10.3)
Chloride: 111 mmol/L (ref 98–111)
Creatinine, Ser: 1.17 mg/dL (ref 0.61–1.24)
GFR calc Af Amer: >60 mL/min (ref >60)
GFR calc non Af Amer: >60 mL/min (ref >60)
Glucose, Bld: 130 mg/dL — ABNORMAL HIGH (ref 70–99)
Potassium: 3.7 mmol/L (ref 3.5–5.1)
Sodium: 143 mmol/L (ref 135–145)

## 2018-11-29 MED ORDER — SODIUM CHLORIDE 0.9 % IV SOLN
INTRAVENOUS | Status: DC
  Administered 2018-11-29 (×2): via INTRAVENOUS

## 2018-11-29 MED ORDER — ENOXAPARIN SODIUM 40 MG/0.4ML ~~LOC~~ SOLN: 40.0000 mg | SUBCUTANEOUS | Status: DC

## 2018-11-29 MED ORDER — ALBUTEROL SULFATE (2.5 MG/3ML) 0.083% IN NEBU: 3.0000 mL | INHALATION_SOLUTION | Freq: Four times a day (QID) | RESPIRATORY_TRACT | Status: DC | PRN

## 2018-11-29 MED ORDER — ONDANSETRON HCL 4 MG PO TABS: 4.0000 mg | ORAL_TABLET | Freq: Four times a day (QID) | ORAL | Status: DC | PRN

## 2018-11-29 MED ORDER — ONDANSETRON HCL 4 MG/2ML IJ SOLN: 4.0000 mg | Freq: Four times a day (QID) | INTRAMUSCULAR | Status: DC | PRN

## 2018-11-29 MED ORDER — MOMETASONE FURO-FORMOTEROL FUM 200-5 MCG/ACT IN AERO
2.0000 | INHALATION_SPRAY | Freq: Two times a day (BID) | RESPIRATORY_TRACT | Status: DC
  Administered 2018-11-29: 2 via RESPIRATORY_TRACT
  Filled 2018-11-29: qty 8.8

## 2018-11-29 MED ORDER — ACETAMINOPHEN 650 MG RE SUPP: 650.0000 mg | Freq: Four times a day (QID) | RECTAL | Status: DC | PRN

## 2018-11-29 MED ORDER — POTASSIUM CHLORIDE CRYS ER 20 MEQ PO TBCR
40.0000 meq | EXTENDED_RELEASE_TABLET | Freq: Once | ORAL | Status: AC
  Administered 2018-11-29: 02:00:00 40 meq via ORAL
  Filled 2018-11-29: qty 2

## 2018-11-29 MED ORDER — PANTOPRAZOLE SODIUM 40 MG PO TBEC
40.0000 mg | DELAYED_RELEASE_TABLET | Freq: Every day | ORAL | Status: DC
  Administered 2018-11-29: 40 mg via ORAL
  Filled 2018-11-29: qty 1

## 2018-11-29 MED ORDER — ESCITALOPRAM OXALATE 10 MG PO TABS
5.0000 mg | ORAL_TABLET | Freq: Every day | ORAL | Status: DC
  Administered 2018-11-29: 11:00:00 5 mg via ORAL
  Filled 2018-11-29: qty 0.5

## 2018-11-29 MED ORDER — POLYETHYLENE GLYCOL 3350 17 G PO PACK: 17.0000 g | PACK | Freq: Every day | ORAL | Status: DC | PRN

## 2018-11-29 MED ORDER — FINASTERIDE 5 MG PO TABS
5.0000 mg | ORAL_TABLET | Freq: Every day | ORAL | Status: DC
  Administered 2018-11-29: 5 mg via ORAL
  Filled 2018-11-29: qty 1

## 2018-11-29 MED ORDER — ACETAMINOPHEN 325 MG PO TABS: 650.0000 mg | ORAL_TABLET | Freq: Four times a day (QID) | ORAL | Status: DC | PRN

## 2018-11-29 NOTE — Progress Notes (Signed)
*  PRELIMINARY RESULTS* Echocardiogram 2D Echocardiogram has been performed.  Cristela Blue 11/29/2018, 9:26 AM

## 2018-11-29 NOTE — Procedures (Signed)
  [facility] Kriya Westra A. Gerilyn Pilgrim, MD     [email or web address]           HISTORY: This is a 33 year old who presents with an episode of syncope that is concerning for possible seizure.  MEDICATIONS:  Current Facility-Administered Medications:  .  0.9 %  sodium chloride infusion, , Intravenous, Continuous, Mayo, Allyn Kenner, MD, Last Rate: 125 mL/hr at 11/29/18 1518 .  acetaminophen (TYLENOL) tablet 650 mg, 650 mg, Oral, Q6H PRN **OR** acetaminophen (TYLENOL) suppository 650 mg, 650 mg, Rectal, Q6H PRN, Mayo, Allyn Kenner, MD .  albuterol (PROVENTIL) (2.5 MG/3ML) 0.083% nebulizer solution 3 mL, 3 mL, Inhalation, Q6H PRN, Mayo, Allyn Kenner, MD .  enoxaparin (LOVENOX) injection 40 mg, 40 mg, Subcutaneous, Q24H, Mayo, Allyn Kenner, MD .  escitalopram (LEXAPRO) tablet 5 mg, 5 mg, Oral, Daily, Mayo, Allyn Kenner, MD, 5 mg at 11/29/18 1031 .  finasteride (PROSCAR) tablet 5 mg, 5 mg, Oral, Daily, Mayo, Allyn Kenner, MD, 5 mg at 11/29/18 1030 .  mometasone-formoterol (DULERA) 200-5 MCG/ACT inhaler 2 puff, 2 puff, Inhalation, BID, Mayo, Allyn Kenner, MD, 2 puff at 11/29/18 0815 .  ondansetron (ZOFRAN) tablet 4 mg, 4 mg, Oral, Q6H PRN **OR** ondansetron (ZOFRAN) injection 4 mg, 4 mg, Intravenous, Q6H PRN, Mayo, Allyn Kenner, MD .  pantoprazole (PROTONIX) EC tablet 40 mg, 40 mg, Oral, Daily, Mayo, Allyn Kenner, MD, 40 mg at 11/29/18 1030 .  polyethylene glycol (MIRALAX / GLYCOLAX) packet 17 g, 17 g, Oral, Daily PRN, Mayo, Allyn Kenner, MD  Current Outpatient Medications:  .  escitalopram (LEXAPRO) 5 MG tablet, Take 1 tablet by mouth daily., Disp: , Rfl:  .  esomeprazole (NEXIUM) 40 MG capsule, Take 1 capsule by mouth daily., Disp: , Rfl:  .  finasteride (PROSCAR) 5 MG tablet, Take 5 mg by mouth daily. , Disp: , Rfl:  .  SYMBICORT 160-4.5 MCG/ACT inhaler, Inhale 2 puffs into the lungs 2 (two) times a day., Disp: , Rfl:      ANALYSIS: A 16 channel recording using standard 10 20 measurements is conducted for 23  minutes.  There is a well-formed posterior dominant rhythm of 11 Hz which attenuates with eye opening.  There is beta activity observed in the frontal areas.  Awake and sleep architecture is observed which transitions to the observation of spindles and K complexes indicating stage II non-REM sleep.  Photic stimulation is carried out without abnormal changes in the background activity.  Focal or lateral slowing is not observed.  There is no epileptiform activity is observed.   IMPRESSION: 1.  This is a normal recording of the awake and sleep states.      Retta Pitcher A. Gerilyn Pilgrim, M.D.  Diplomate, Biomedical engineer of Psychiatry and Neurology ( Neurology).

## 2018-11-29 NOTE — ED Notes (Signed)
ED TO INPATIENT HANDOFF REPORT  ED Nurse Name and Phone #: Cortavious Nix  S Name/Age/Gender Jerry Santana 33 y.o. male Room/Bed: ED02A/ED02A  Code Status   Code Status: Not on file  Home/SNF/Other Home Patient oriented to: self, place, time and situation Is this baseline? Yes   Triage Complete: Triage complete  Chief Complaint Unresponsive  Triage Note PT to ED via EMS from home, was called out for unresponsive, per EMS pt was unresponsive on arrival and EMS began bagging. PT was given  of narcan with improvement. PT is alert on arrival, MD at bedside    Allergies No Known Allergies  Level of Care/Admitting Diagnosis ED Disposition    ED Disposition Condition Comment   Admit  Hospital Area: [facility] [[id number]]  Level of Care: Telemetry [5]  Covid Evaluation: N/A  Diagnosis: Syncope [206001]  Admitting Physician: Willadean Carol DODD [[id number]]  Attending Physician: Willadean Carol DODD [[id number]]  PT Class (Do Not Modify): Observation [104]  PT Acc Code (Do Not Modify): Observation [10022]       B Medical/Surgery History Past Medical History:  Diagnosis Date  . Asthma   . Depression    History reviewed. No pertinent surgical history.   A IV Location/Drains/Wounds Patient Lines/Drains/Airways Status   Active Line/Drains/Airways    Name:   Placement date:   Placement time:   Site:   Days:   Peripheral IV 11/28/18 Left Forearm   11/28/18    2153    Forearm   1          Intake/Output Last 24 hours No intake or output data in the 24 hours ending 11/29/18 0058  Labs/Imaging Results for orders placed or performed during the hospital encounter of 11/28/18 (from the past 48 hour(s))  Comprehensive metabolic panel     Status: Abnormal   Collection Time: 11/28/18  9:53 PM  Result Value Ref Range   Sodium 139 135 - 145 mmol/L   Potassium 3.4 (L) 3.5 - 5.1 mmol/L   Chloride 102 98 - 111 mmol/L   CO2 23 22 - 32 mmol/L   Glucose, Bld 248 (H) 70 - 99  mg/dL   BUN 19 6 - 20 mg/dL   Creatinine, Ser 2.13 (H) 0.61 - 1.24 mg/dL   Calcium 8.9 8.9 - 08.6 mg/dL   Total Protein 7.1 6.5 - 8.1 g/dL   Albumin 4.8 3.5 - 5.0 g/dL   AST 24 15 - 41 U/L   ALT 22 0 - 44 U/L   Alkaline Phosphatase 42 38 - 126 U/L   Total Bilirubin 1.1 0.3 - 1.2 mg/dL   GFR calc non Af Amer >60 >60 mL/min   GFR calc Af Amer >60 >60 mL/min   Anion gap 14 5 - 15    Comment: Performed at [facility], [address] [place], [place] [address]  CBC with Differential     Status: Abnormal   Collection Time: 11/28/18  9:53 PM  Result Value Ref Range   WBC 7.6 4.0 - 10.5 K/uL   RBC 5.06 4.22 - 5.81 MIL/uL   Hemoglobin 15.1 13.0 - 17.0 g/dL   HCT 96.2 95.2 - 84.1 %   MCV 86.2 80.0 - 100.0 fL   MCH 29.8 26.0 - 34.0 pg   MCHC 34.6 30.0 - 36.0 g/dL   RDW 32.4 40.1 - 02.7 %   Platelets 281 150 - 400 K/uL   nRBC 0.0 0.0 - 0.2 %   Neutrophils Relative % 41 %  Neutro Abs 3.1 1.7 - 7.7 K/uL   Lymphocytes Relative 53 %   Lymphs Abs 4.0 0.7 - 4.0 K/uL   Monocytes Relative 5 %   Monocytes Absolute 0.4 0.1 - 1.0 K/uL   Eosinophils Relative 0 %   Eosinophils Absolute 0.0 0.0 - 0.5 K/uL   Basophils Relative 0 %   Basophils Absolute 0.0 0.0 - 0.1 K/uL   Immature Granulocytes 1 %   Abs Immature Granulocytes 0.08 (H) 0.00 - 0.07 K/uL    Comment: Performed at [facility], [address] [place], [place] [address]  Lactic acid, plasma     Status: Abnormal   Collection Time: 11/28/18  9:53 PM  Result Value Ref Range   Lactic Acid, Venous 2.8 (HH) 0.5 - 1.9 mmol/L    Comment: CRITICAL RESULT CALLED TO, READ BACK BY AND VERIFIED WITH Breyer Tejera AT 2304 11/28/2018 SMA Performed at [facility], [address] [place], [place] [address]   Troponin I - Once     Status: None   Collection Time: 11/28/18  9:53 PM  Result Value Ref Range   Troponin I <0.03 <0.03 ng/mL    Comment: Performed at [facility], [address] [place], [place]  [address]  Ethanol     Status: None   Collection Time: 11/28/18  9:53 PM  Result Value Ref Range   Alcohol, Ethyl (B) <10 <10 mg/dL    Comment: (NOTE) Lowest detectable limit for serum alcohol is 10 mg/dL. For medical purposes only. Performed at [facility], [address] [place], [place] [address]   Acetaminophen level     Status: Abnormal   Collection Time: 11/28/18  9:53 PM  Result Value Ref Range   Acetaminophen (Tylenol), Serum <10 (L) 10 - 30 ug/mL    Comment: (NOTE) Therapeutic concentrations vary significantly. A range of 10-30 ug/mL  may be an effective concentration for many patients. However, some  are best treated at concentrations outside of this range. Acetaminophen concentrations >150 ug/mL at 4 hours after ingestion  and >50 ug/mL at 12 hours after ingestion are often associated with  toxic reactions. Performed at [facility], [address] [place], [place] [address]   Salicylate level     Status: None   Collection Time: 11/28/18  9:53 PM  Result Value Ref Range   Salicylate Lvl <7.0 2.8 - 30.0 mg/dL    Comment: Performed at [facility], [address] [place], [place] [address]  Blood gas, venous     Status: None (Preliminary result)   Collection Time: 11/28/18  9:56 PM  Result Value Ref Range   pH, Ven 7.29 7.250 - 7.430   pCO2, Ven 56 44.0 - 60.0 mmHg   pO2, Ven PENDING 32.0 - 45.0 mmHg   Bicarbonate 26.9 20.0 - 28.0 mmol/L   Acid-base deficit 0.7 0.0 - 2.0 mmol/L   O2 Saturation 39.4 %   Patient temperature 37.0    Collection site LINE    Sample type VENOUS     Comment: Performed at [facility], [address], [place], [place] [address]   Ct Head Wo Contrast  Result Date: 11/28/2018 CLINICAL DATA:  Initial evaluation for acute altered mental status, unresponsiveness. EXAM: CT HEAD WITHOUT CONTRAST TECHNIQUE: Contiguous axial images were obtained from the base of the skull through the vertex without  intravenous contrast. COMPARISON:  None available. FINDINGS: Brain: Cerebral volume within normal limits for patient age. No evidence for acute intracranial  hemorrhage. No findings to suggest acute large vessel territory infarct. No mass lesion, midline shift, or mass effect. Ventricles are normal in size without evidence for hydrocephalus. No extra-axial fluid collection identified. Vascular: No hyperdense vessel identified. Skull: Scalp soft tissues demonstrate no acute abnormality. Calvarium intact. Sinuses/Orbits: Globes and orbital soft tissues within normal limits. Visualized paranasal sinuses are clear. No mastoid effusion. IMPRESSION: Negative head CT.  No acute intracranial abnormality identified. Electronically Signed   By: Rise MuBenjamin  McClintock M.D.   On: 11/28/2018 23:14   Dg Chest Port 1 View  Result Date: 11/28/2018 CLINICAL DATA:  33 year old male is unresponsive. EXAM: PORTABLE CHEST 1 VIEW COMPARISON:  06/22/2018. FINDINGS: Portable AP upright view at 2157 hours. Low lung volumes. Normal cardiac size and mediastinal contours. Visualized tracheal air column is within normal limits. Allowing for portable technique the lungs are clear. No pneumothorax or pleural effusion. Paucity bowel gas in the upper abdomen. Negative visible osseous structures. IMPRESSION: Low lung volumes.  No acute cardiopulmonary abnormality. Electronically Signed   By: Odessa FlemingH  Hall M.D.   On: 11/28/2018 22:28    Pending Labs Unresulted Labs (From admission, onward)    Start     Ordered   11/28/18 2157  Urinalysis, Complete w Microscopic  ONCE - STAT,   STAT     11/28/18 2156   11/28/18 2157  Urine Drug Screen, Qualitative  Once,   STAT     11/28/18 2156   11/28/18 2156  Lactic acid, plasma  Now then every 2 hours,   STAT     11/28/18 2156   Signed and Held  HIV antibody (Routine Testing)  Once,   R     Signed and Held   Signed and Held  Basic metabolic panel  Tomorrow morning,   R     Signed and Held   Signed and  Held  CBC  Tomorrow morning,   R     Signed and Held          Vitals/Pain Today's Vitals   11/28/18 2248 11/28/18 2302 11/28/18 2330 11/29/18 0000  BP:  (!) 101/58  114/74  Pulse: 73  74   Resp: 11  18 12   Temp:      TempSrc:      SpO2: 100%  100%     Isolation Precautions No active isolations  Medications Medications  ondansetron (ZOFRAN) injection 4 mg (4 mg Intravenous Given 11/28/18 2152)  sodium chloride 0.9 % bolus 500 mL (0 mLs Intravenous Stopped 11/29/18 0016)  sodium chloride 0.9 % bolus 500 mL (500 mLs Intravenous New Bag/Given 11/29/18 0019)    Mobility walks High fall risk   Focused Assessments Cardiac Assessment Handoff:  Cardiac Rhythm: Normal sinus rhythm Lab Results  Component Value Date   TROPONINI <0.03 11/28/2018   No results found for: DDIMER Does the Patient currently have chest pain? No     R Recommendations: See Admitting Provider Note  Report given to:   Additional Notes: none

## 2018-11-29 NOTE — Discharge Instructions (Signed)
Syncope    Syncope refers to a condition in which a person temporarily loses consciousness. Syncope may also be called fainting or passing out. It is caused by a sudden decrease in blood flow to the brain. Even though most causes of syncope are not dangerous, syncope can be a sign of a serious medical problem. Your health care provider may do tests to find the reason why you are having syncope.  Signs that you may be about to faint include:   Feeling dizzy or light-headed.   Feeling nauseous.   Seeing all white or all black in your field of vision.   Having cold, clammy skin.  If you faint, get medical help right away. Call your local emergency services (911 in the [place] Do not drive yourself to the hospital.  Follow these instructions at home:  Pay attention to any changes in your symptoms. Take these actions to stay safe and to help relieve your symptoms:  Lifestyle   Do not drive, use machinery, or play sports until your health care provider says it is okay.   Do not drink alcohol.   Do not use any products that contain nicotine or tobacco, such as cigarettes and e-cigarettes. If you need help quitting, ask your health care provider.   Drink enough fluid to keep your urine pale yellow.  General instructions   Take over-the-counter and prescription medicines only as told by your health care provider.   If you are taking blood pressure or heart medicine, get up slowly and take several minutes to sit and then stand. This can reduce dizziness or light-headedness.   Have someone stay with you until you feel stable.   If you start to feel like you might faint, lie down right away and raise (elevate) your feet above the level of your heart. Breathe deeply and steadily. Wait until all the symptoms have passed.   Keep all follow-up visits as told by your health care provider. This is important.  Get help right away if you:   Have a severe headache.   Faint once or repeatedly.   Have pain in your chest,  abdomen, or back.   Have a very fast or irregular heartbeat (palpitations).   Have pain when you breathe.   Are bleeding from your mouth or rectum, or you have black or tarry stool.   Have a seizure.   Are confused.   Have trouble walking.   Have severe weakness.   Have vision problems.  These symptoms may represent a serious problem that is an emergency. Do not wait to see if your symptoms will go away. Get medical help right away. Call your local emergency services (911 in the [place] Do not drive yourself to the hospital.  Summary   Syncope refers to a condition in which a person temporarily loses consciousness. It is caused by a sudden decrease in blood flow to the brain.   Signs that you may be about to faint include dizziness, feeling light-headed, feeling nauseous, sudden vision changes, or cold, clammy skin.   Although most causes of syncope are not dangerous, syncope can be a sign of a serious medical problem. If you faint, get medical help right away.  This information is not intended to replace advice given to you by your health care provider. Make sure you discuss any questions you have with your health care provider.  Document Released: 07/19/2005 Document Revised: 06/27/2017 Document Reviewed: 06/27/2017  Elsevier Interactive Patient Education  2019 [employer]

## 2018-11-29 NOTE — ED Notes (Signed)
Report called to michelle rn floor nurse 

## 2018-11-29 NOTE — Discharge Summary (Signed)
[facility] -  at [facility]   PATIENT NAME: Jerry Santana    MR#:  [id number]  DATE OF BIRTH:  05/13/1986  DATE OF ADMISSION:  11/28/2018 ADMITTING PHYSICIAN: Campbell StallKaty Dodd Mayo, MD  DATE OF DISCHARGE: 11/29/2018  PRIMARY CARE PHYSICIAN: Jerl MinaHedrick, James, MD    ADMISSION DIAGNOSIS:  Syncope and collapse [R55] SOB (shortness of breath) [R06.02]  DISCHARGE DIAGNOSIS:  Active Problems:   Syncope   SECONDARY DIAGNOSIS:   Past Medical History:  Diagnosis Date  . Asthma   . Depression     HOSPITAL COURSE:  33 year old male with history of depression and asthma who presented to the emergency room due to syncope.  1.  Syncope of unclear etiology: MRI and echocardiogram are negative.  Patient will need to have outpatient follow-up with cardiology for 30-day event recorder and outpatient follow-up with neurology for EEG. Telemetry showed no abnormalities.  2.  Mild intermittent asthma without signs of exacerbation 3.  Depression: Continue Lexapro. 4.  Acute kidney injury in the setting of mild dehydration which improved with IV fluids.   DISCHARGE CONDITIONS AND DIET:   Stable regular diet  CONSULTS OBTAINED:    DRUG ALLERGIES:  No Known Allergies  DISCHARGE MEDICATIONS:   Allergies as of 11/29/2018   No Known Allergies     Medication List    STOP taking these medications   albuterol 108 (90 Base) MCG/ACT inhaler Commonly known as:  VENTOLIN HFA   amoxicillin-clavulanate 875-125 MG tablet Commonly known as:  Augmentin   benzonatate 100 MG capsule Commonly known as:  TESSALON   predniSONE 10 MG (21) Tbpk tablet Commonly known as:  STERAPRED UNI-PAK 21 TAB     TAKE these medications   escitalopram 5 MG tablet Commonly known as:  LEXAPRO Take 1 tablet by mouth daily.   esomeprazole 40 MG capsule Commonly known as:  NEXIUM Take 1 capsule by mouth daily.   finasteride 5 MG tablet Commonly known as:  PROSCAR Take 5 mg by mouth  daily.   Symbicort 160-4.5 MCG/ACT inhaler Generic drug:  budesonide-formoterol Inhale 2 puffs into the lungs 2 (two) times a day.         Today   CHIEF COMPLAINT:   Doing well no chest pain or shortness of breath.  No dizziness or lightheadedness. No seizure-like activity   VITAL SIGNS:  Blood pressure 108/72, pulse 77, temperature 98 F (36.7 C), temperature source Oral, resp. rate 20, height 5\' 7"  (1.702 m), weight 83 kg, SpO2 94 %.   REVIEW OF SYSTEMS:  Review of Systems  Constitutional: Negative.  Negative for chills, fever and malaise/fatigue.  HENT: Negative.  Negative for ear discharge, ear pain, hearing loss, nosebleeds and sore throat.   Eyes: Negative.  Negative for blurred vision and pain.  Respiratory: Negative.  Negative for cough, hemoptysis, shortness of breath and wheezing.   Cardiovascular: Negative.  Negative for chest pain, palpitations and leg swelling.  Gastrointestinal: Negative.  Negative for abdominal pain, blood in stool, diarrhea, nausea and vomiting.  Genitourinary: Negative.  Negative for dysuria.  Musculoskeletal: Negative.  Negative for back pain.  Skin: Negative.   Neurological: Negative for dizziness, tremors, speech change, focal weakness, seizures and headaches.  Endo/Heme/Allergies: Negative.  Does not bruise/bleed easily.  Psychiatric/Behavioral: Negative.  Negative for depression, hallucinations and suicidal ideas.     PHYSICAL EXAMINATION:  GENERAL:  33 y.o.-year-old patient lying in the bed with no acute distress.  NECK:  Supple, no jugular venous distention. No thyroid enlargement,  no tenderness.  LUNGS: Normal breath sounds bilaterally, no wheezing, rales,rhonchi  No use of accessory muscles of respiration.  CARDIOVASCULAR: S1, S2 normal. No murmurs, rubs, or gallops.  ABDOMEN: Soft, non-tender, non-distended. Bowel sounds present. No organomegaly or mass.  EXTREMITIES: No pedal edema, cyanosis, or clubbing.  PSYCHIATRIC: The  patient is alert and oriented x 3.  SKIN: No obvious rash, lesion, or ulcer.   DATA REVIEW:   CBC Recent Labs  Lab 11/29/18 0146  WBC 16.2*  HGB 13.8  HCT 39.3  PLT 229    Chemistries  Recent Labs  Lab 11/28/18 2153 11/29/18 0146  NA 139 143  K 3.4* 3.7  CL 102 111  CO2 23 26  GLUCOSE 248* 130*  BUN 19 17  CREATININE 1.40* 1.17  CALCIUM 8.9 8.6*  AST 24  --   ALT 22  --   ALKPHOS 42  --   BILITOT 1.1  --     Cardiac Enzymes Recent Labs  Lab 11/28/18 2153  TROPONINI <0.03    Microbiology Results  @  RADIOLOGY:  Ct Head Wo Contrast  Result Date: 11/28/2018 CLINICAL DATA:  Initial evaluation for acute altered mental status, unresponsiveness. EXAM: CT HEAD WITHOUT CONTRAST TECHNIQUE: Contiguous axial images were obtained from the base of the skull through the vertex without intravenous contrast. COMPARISON:  None available. FINDINGS: Brain: Cerebral volume within normal limits for patient age. No evidence for acute intracranial hemorrhage. No findings to suggest acute large vessel territory infarct. No mass lesion, midline shift, or mass effect. Ventricles are normal in size without evidence for hydrocephalus. No extra-axial fluid collection identified. Vascular: No hyperdense vessel identified. Skull: Scalp soft tissues demonstrate no acute abnormality. Calvarium intact. Sinuses/Orbits: Globes and orbital soft tissues within normal limits. Visualized paranasal sinuses are clear. No mastoid effusion. IMPRESSION: Negative head CT.  No acute intracranial abnormality identified. Electronically Signed   By: Rise Mu M.D.   On: 11/28/2018 23:14   Mr Brain Wo Contrast  Result Date: 11/29/2018 CLINICAL DATA:  Recurrent syncope. EXAM: MRI HEAD WITHOUT CONTRAST TECHNIQUE: Multiplanar, multiecho pulse sequences of the brain and surrounding structures were obtained without intravenous contrast. COMPARISON:  CT head performed 11/28/2018. FINDINGS: Brain: No  evidence for acute infarction, hemorrhage, mass lesion, hydrocephalus, or extra-axial fluid. Normal cerebral volume. Single punctate focus of white matter signal abnormality, adjacent to LEFT posterior frontal cortex, non worrisome. Coronal imaging demonstrates no temporal lobe abnormality. Vascular: Normal flow voids. Skull and upper cervical spine: Normal marrow signal. Sinuses/Orbits: Negative. Other: None. IMPRESSION: Negative exam. No acute intracranial findings. No cause seen for the reported symptoms. Electronically Signed   By: Elsie Stain M.D.   On: 11/29/2018 10:44   Dg Chest Port 1 View  Result Date: 11/28/2018 CLINICAL DATA:  33 year old male is unresponsive. EXAM: PORTABLE CHEST 1 VIEW COMPARISON:  06/22/2018. FINDINGS: Portable AP upright view at 2157 hours. Low lung volumes. Normal cardiac size and mediastinal contours. Visualized tracheal air column is within normal limits. Allowing for portable technique the lungs are clear. No pneumothorax or pleural effusion. Paucity bowel gas in the upper abdomen. Negative visible osseous structures. IMPRESSION: Low lung volumes.  No acute cardiopulmonary abnormality. Electronically Signed   By: Odessa Fleming M.D.   On: 11/28/2018 22:28      Allergies as of 11/29/2018   No Known Allergies     Medication List    STOP taking these medications   albuterol 108 (90 Base) MCG/ACT inhaler Commonly known as:  VENTOLIN  HFA   amoxicillin-clavulanate 875-125 MG tablet Commonly known as:  Augmentin   benzonatate 100 MG capsule Commonly known as:  TESSALON   predniSONE 10 MG (21) Tbpk tablet Commonly known as:  STERAPRED UNI-PAK 21 TAB     TAKE these medications   escitalopram 5 MG tablet Commonly known as:  LEXAPRO Take 1 tablet by mouth daily.   esomeprazole 40 MG capsule Commonly known as:  NEXIUM Take 1 capsule by mouth daily.   finasteride 5 MG tablet Commonly known as:  PROSCAR Take 5 mg by mouth daily.   Symbicort 160-4.5 MCG/ACT  inhaler Generic drug:  budesonide-formoterol Inhale 2 puffs into the lungs 2 (two) times a day.         Management plans discussed with the patient and he is in agreement. Stable for discharge home  Patient should follow up with cardiology  CODE STATUS:     Code Status Orders  (From admission, onward)         Start     Ordered   11/29/18 0129  Full code  Continuous     11/29/18 0128        Code Status History    This patient has a current code status but no historical code status.      TOTAL TIME TAKING CARE OF THIS PATIENT: 38 minutes.    Note: This dictation was prepared with Dragon dictation along with smaller phrase technology. Any transcriptional errors that result from this process are unintentional.  Adrian Saran M.D on 11/29/2018 at 12:35 PM  Between 7am to 6pm - Pager - [phone] After 6pm go to [email or web address] - Social research officer, government  [facility]  Office  [phone]  CC: Primary care physician; Jerl Mina, MD

## 2018-11-29 NOTE — Plan of Care (Signed)
  Problem: Education: Goal: Knowledge of General Education information will improve Description: Including pain rating scale, medication(s)/side effects and non-pharmacologic comfort measures Outcome: Progressing   Problem: Safety: Goal: Ability to remain free from injury will improve Outcome: Progressing   

## 2018-11-29 NOTE — Progress Notes (Signed)
eeg complete.

## 2018-11-30 LAB — BLOOD GAS, VENOUS
Acid-base deficit: 0.7 mmol/L (ref 0.0–2.0)
Bicarbonate: 26.9 mmol/L (ref 20.0–28.0)
O2 Saturation: 39.4 %
Patient temperature: 37
pCO2, Ven: 56 mmHg (ref 44.0–60.0)
pH, Ven: 7.29 (ref 7.250–7.430)

## 2018-11-30 LAB — HIV ANTIBODY (ROUTINE TESTING W REFLEX): HIV Screen 4th Generation wRfx: NONREACTIVE

## 2018-11-30 NOTE — Consult Note (Signed)
Reason for Consult: Syncope Referring Physician: Dr. Burnett Sheng primary,  Dr. Bertrum Sol hospitalist  Jerry Santana is an 33 y.o. male.  HPI: Is a 33 year old male originally from [place] for 32 patient says his second episode of syncope.  Patient suddenly passed initially about a year ago he passed out while running this time he was at home after eating a meal with his wife and had an episode where he lost consciousness his wife states that he was in a semiconscious state he was still trying to get up and move and walk but it took him several minutes to regain full consciousness rescue was called and he was brought to the emergency room for further assessment evaluation.  Patient had vague chest pain symptoms shortness of breath palpitations tachycardia denies nausea vomiting or diarrhea.  Denies any seizure-like activity no incontinence patient states he does not have a warning before he passes out he has had 2 mild episodes where he did not fully lose consciousness but was short of breath with palpitations and tachycardia.  Patient feels reasonably well now  Past Medical History:  Diagnosis Date  . Asthma   . Depression     History reviewed. No pertinent surgical history.  No family history on file.  Social History:  reports that he has never smoked. He has never used smokeless tobacco. He reports current alcohol use. He reports that he does not use drugs.  Allergies: No Known Allergies  Medications: I have reviewed the patient's current medications.  Results for orders placed or performed during the hospital encounter of 11/28/18 (from the past 48 hour(s))  Comprehensive metabolic panel     Status: Abnormal   Collection Time: 11/28/18  9:53 PM  Result Value Ref Range   Sodium 139 135 - 145 mmol/L   Potassium 3.4 (L) 3.5 - 5.1 mmol/L   Chloride 102 98 - 111 mmol/L   CO2 23 22 - 32 mmol/L   Glucose, Bld 248 (H) 70 - 99 mg/dL   BUN 19 6 - 20 mg/dL   Creatinine, Ser 1.61 (H) 0.61 -  1.24 mg/dL   Calcium 8.9 8.9 - 09.6 mg/dL   Total Protein 7.1 6.5 - 8.1 g/dL   Albumin 4.8 3.5 - 5.0 g/dL   AST 24 15 - 41 U/L   ALT 22 0 - 44 U/L   Alkaline Phosphatase 42 38 - 126 U/L   Total Bilirubin 1.1 0.3 - 1.2 mg/dL   GFR calc non Af Amer >60 >60 mL/min   GFR calc Af Amer >60 >60 mL/min   Anion gap 14 5 - 15    Comment: Performed at [facility], [address] [place], [place] [address]  CBC with Differential     Status: Abnormal   Collection Time: 11/28/18  9:53 PM  Result Value Ref Range   WBC 7.6 4.0 - 10.5 K/uL   RBC 5.06 4.22 - 5.81 MIL/uL   Hemoglobin 15.1 13.0 - 17.0 g/dL   HCT 98.1 19.1 - 47.8 %   MCV 86.2 80.0 - 100.0 fL   MCH 29.8 26.0 - 34.0 pg   MCHC 34.6 30.0 - 36.0 g/dL   RDW 29.5 62.1 - 30.8 %   Platelets 281 150 - 400 K/uL   nRBC 0.0 0.0 - 0.2 %   Neutrophils Relative % 41 %   Neutro Abs 3.1 1.7 - 7.7 K/uL   Lymphocytes Relative 53 %   Lymphs Abs 4.0 0.7 - 4.0 K/uL   Monocytes  Relative 5 %   Monocytes Absolute 0.4 0.1 - 1.0 K/uL   Eosinophils Relative 0 %   Eosinophils Absolute 0.0 0.0 - 0.5 K/uL   Basophils Relative 0 %   Basophils Absolute 0.0 0.0 - 0.1 K/uL   Immature Granulocytes 1 %   Abs Immature Granulocytes 0.08 (H) 0.00 - 0.07 K/uL    Comment: Performed at [facility], [address] [place], [place] [address]  Lactic acid, plasma     Status: Abnormal   Collection Time: 11/28/18  9:53 PM  Result Value Ref Range   Lactic Acid, Venous 2.8 (HH) 0.5 - 1.9 mmol/L    Comment: CRITICAL RESULT CALLED TO, READ BACK BY AND VERIFIED WITH AMY COYNE AT 2304 11/28/2018 SMA Performed at [facility], [address] [place], [place] [address]   Troponin I - Once     Status: None   Collection Time: 11/28/18  9:53 PM  Result Value Ref Range   Troponin I <0.03 <0.03 ng/mL    Comment: Performed at [facility], [address] [place], [place] [address]  Ethanol     Status: None   Collection Time: 11/28/18   9:53 PM  Result Value Ref Range   Alcohol, Ethyl (B) <10 <10 mg/dL    Comment: (NOTE) Lowest detectable limit for serum alcohol is 10 mg/dL. For medical purposes only. Performed at [facility], [address] [place], [place] [address]   Acetaminophen level     Status: Abnormal   Collection Time: 11/28/18  9:53 PM  Result Value Ref Range   Acetaminophen (Tylenol), Serum <10 (L) 10 - 30 ug/mL    Comment: (NOTE) Therapeutic concentrations vary significantly. A range of 10-30 ug/mL  may be an effective concentration for many patients. However, some  are best treated at concentrations outside of this range. Acetaminophen concentrations >150 ug/mL at 4 hours after ingestion  and >50 ug/mL at 12 hours after ingestion are often associated with  toxic reactions. Performed at [facility], [address] [place], [place] [address]   Salicylate level     Status: None   Collection Time: 11/28/18  9:53 PM  Result Value Ref Range   Salicylate Lvl <7.0 2.8 - 30.0 mg/dL    Comment: Performed at [facility], [address] [place], [place] [address]  Blood gas, venous     Status: None   Collection Time: 11/28/18  9:56 PM  Result Value Ref Range   pH, Ven 7.29 7.250 - 7.430   pCO2, Ven 56 44.0 - 60.0 mmHg   Bicarbonate 26.9 20.0 - 28.0 mmol/L   Acid-base deficit 0.7 0.0 - 2.0 mmol/L   O2 Saturation 39.4 %   Patient temperature 37.0    Collection site LINE    Sample type VENOUS     Comment: Performed at [facility], [address] [place], [place] [address]  Lactic acid, plasma     Status: None   Collection Time: 11/29/18  1:46 AM  Result Value Ref Range   Lactic Acid, Venous 1.7 0.5 - 1.9 mmol/L    Comment: Performed at [facility], [address] [place], [place] [address]  HIV antibody (Routine Testing)     Status: None   Collection Time: 11/29/18  1:46 AM  Result Value Ref Range   HIV Screen 4th Generation wRfx Non Reactive  Non Reactive    Comment: (NOTE) Performed At: [facility] [address] [place], [place]  [id number] Jolene SchimkeNagendra Sanjai MD DG:[id number]:[phone]   Basic metabolic panel     Status: Abnormal   Collection Time: 11/29/18  1:46 AM  Result Value Ref Range   Sodium 143 135 - 145 mmol/L   Potassium 3.7 3.5 - 5.1 mmol/L   Chloride 111 98 - 111 mmol/L   CO2 26 22 - 32 mmol/L   Glucose, Bld 130 (H) 70 - 99 mg/dL   BUN 17 6 - 20 mg/dL   Creatinine, Ser 9.561.17 0.61 - 1.24 mg/dL   Calcium 8.6 (L) 8.9 - 10.3 mg/dL   GFR calc non Af Amer >60 >60 mL/min   GFR calc Af Amer >60 >60 mL/min   Anion gap 6 5 - 15    Comment: Performed at [facility], [address] [place], [place] [address]  CBC     Status: Abnormal   Collection Time: 11/29/18  1:46 AM  Result Value Ref Range   WBC 16.2 (H) 4.0 - 10.5 K/uL   RBC 4.59 4.22 - 5.81 MIL/uL   Hemoglobin 13.8 13.0 - 17.0 g/dL   HCT 43.339.3 29.539.0 - 18.852.0 %   MCV 85.6 80.0 - 100.0 fL   MCH 30.1 26.0 - 34.0 pg   MCHC 35.1 30.0 - 36.0 g/dL   RDW 41.611.8 60.611.5 - 30.115.5 %   Platelets 229 150 - 400 K/uL   nRBC 0.0 0.0 - 0.2 %    Comment: Performed at [facility], [address] [place], [place] [address]    Ct Head Wo Contrast  Result Date: 11/28/2018 CLINICAL DATA:  Initial evaluation for acute altered mental status, unresponsiveness. EXAM: CT HEAD WITHOUT CONTRAST TECHNIQUE: Contiguous axial images were obtained from the base of the skull through the vertex without intravenous contrast. COMPARISON:  None available. FINDINGS: Brain: Cerebral volume within normal limits for patient age. No evidence for acute intracranial hemorrhage. No findings to suggest acute large vessel territory infarct. No mass lesion, midline shift, or mass effect. Ventricles are normal in size without evidence for hydrocephalus. No extra-axial fluid collection identified. Vascular: No hyperdense vessel identified. Skull: Scalp soft tissues demonstrate no acute abnormality.  Calvarium intact. Sinuses/Orbits: Globes and orbital soft tissues within normal limits. Visualized paranasal sinuses are clear. No mastoid effusion. IMPRESSION: Negative head CT.  No acute intracranial abnormality identified. Electronically Signed   By: Rise MuBenjamin  McClintock M.D.   On: 11/28/2018 23:14   Mr Brain Wo Contrast  Result Date: 11/29/2018 CLINICAL DATA:  Recurrent syncope. EXAM: MRI HEAD WITHOUT CONTRAST TECHNIQUE: Multiplanar, multiecho pulse sequences of the brain and surrounding structures were obtained without intravenous contrast. COMPARISON:  CT head performed 11/28/2018. FINDINGS: Brain: No evidence for acute infarction, hemorrhage, mass lesion, hydrocephalus, or extra-axial fluid. Normal cerebral volume. Single punctate focus of white matter signal abnormality, adjacent to LEFT posterior frontal cortex, non worrisome. Coronal imaging demonstrates no temporal lobe abnormality. Vascular: Normal flow voids. Skull and upper cervical spine: Normal marrow signal. Sinuses/Orbits: Negative. Other: None. IMPRESSION: Negative exam. No acute intracranial findings. No cause seen for the reported symptoms. Electronically Signed   By: Elsie StainJohn T Curnes M.D.   On: 11/29/2018 10:44   Dg Chest Port 1 View  Result Date: 11/28/2018 CLINICAL DATA:  33 year old male is unresponsive. EXAM: PORTABLE CHEST 1 VIEW COMPARISON:  06/22/2018. FINDINGS: Portable AP upright view at 2157 hours. Low lung volumes. Normal cardiac size and mediastinal contours. Visualized tracheal air column is within normal limits. Allowing for portable technique the lungs are clear. No pneumothorax or pleural  effusion. Paucity bowel gas in the upper abdomen. Negative visible osseous structures. IMPRESSION: Low lung volumes.  No acute cardiopulmonary abnormality. Electronically Signed   By: Odessa Fleming M.D.   On: 11/28/2018 22:28    Review of Systems  Constitutional: Positive for diaphoresis and malaise/fatigue.  HENT: Positive for congestion.    Eyes: Positive for blurred vision.  Respiratory: Positive for shortness of breath.   Cardiovascular: Positive for chest pain.  Gastrointestinal: Positive for heartburn.  Genitourinary: Negative.   Musculoskeletal: Positive for myalgias.  Skin: Negative.   Neurological: Positive for dizziness, loss of consciousness and weakness.  Endo/Heme/Allergies: Negative.    Blood pressure 108/72, pulse 77, temperature 98 F (36.7 C), temperature source Oral, resp. rate 20, height 5\' 7"  (1.702 m), weight 83 kg, SpO2 94 %. Physical Exam  Nursing note and vitals reviewed. Constitutional: He is oriented to person, place, and time. He appears well-developed and well-nourished.  HENT:  Head: Normocephalic and atraumatic.  Eyes: Pupils are equal, round, and reactive to light. Conjunctivae and EOM are normal.  Neck: Normal range of motion. Neck supple.  Cardiovascular: Normal rate, regular rhythm and normal heart sounds.  Respiratory: Effort normal and breath sounds normal.  GI: Soft. Bowel sounds are normal.  Musculoskeletal: Normal range of motion.  Neurological: He is alert and oriented to person, place, and time. He has normal reflexes.  Skin: Skin is warm and dry.  Psychiatric: He has a normal mood and affect.    Assessment/Plan: Blackout spells Syncope LOC Palpitations Hypokalemia Asthma Depression . Plan Agree with admission rule out myocardial infarction Follow-up telemetry Agree with neurology evaluation Recommend EEG Negative CT and MRI Recommend consider Linq device for evaluation of arrhythmia Recommend EP evaluation and referral Continue Lexapro for mild depression Inhalers as necessary for asthma Have the patient follow-up with cardiology as an outpatient to arrange EP as well as possibly Linq device   D  11/30/2018, 9:58 AM

## 2019-01-22 ENCOUNTER — Other Ambulatory Visit: Payer: Self-pay | Admitting: Neurology

## 2019-01-22 DIAGNOSIS — R0602 Shortness of breath: Secondary | ICD-10-CM

## 2019-01-24 ENCOUNTER — Ambulatory Visit
Admission: RE | Admit: 2019-01-24 | Discharge: 2019-01-24 | Disposition: A | Discharge status: Home / Self Care | Payer: BLUE CROSS/BLUE SHIELD | Source: Ambulatory Visit | Attending: Neurology | Admitting: Neurology

## 2019-01-24 ENCOUNTER — Other Ambulatory Visit: Payer: Self-pay

## 2019-01-24 DIAGNOSIS — R0602 Shortness of breath: Secondary | ICD-10-CM | POA: Diagnosis present

## 2019-01-24 MED ORDER — IOHEXOL 350 MG/ML SOLN
75.0000 mL | Freq: Once | INTRAVENOUS | Status: AC | PRN
  Administered 2019-01-24: 75 mL via INTRAVENOUS

## 2019-01-29 ENCOUNTER — Other Ambulatory Visit: Payer: Self-pay | Admitting: Nurse Practitioner

## 2019-01-29 DIAGNOSIS — K219 Gastro-esophageal reflux disease without esophagitis: Secondary | ICD-10-CM

## 2019-02-05 ENCOUNTER — Other Ambulatory Visit: Payer: Self-pay

## 2019-02-05 ENCOUNTER — Ambulatory Visit
Admission: RE | Admit: 2019-02-05 | Discharge: 2019-02-05 | Disposition: A | Discharge status: Home / Self Care | Payer: BLUE CROSS/BLUE SHIELD | Source: Ambulatory Visit | Attending: Nurse Practitioner | Admitting: Nurse Practitioner

## 2019-02-05 DIAGNOSIS — K219 Gastro-esophageal reflux disease without esophagitis: Secondary | ICD-10-CM

## 2019-05-11 ENCOUNTER — Other Ambulatory Visit: Payer: Self-pay

## 2019-05-11 DIAGNOSIS — Z0000 Encounter for general adult medical examination without abnormal findings: Secondary | ICD-10-CM

## 2019-05-12 LAB — MEASLES/MUMPS/RUBELLA IMMUNITY
MUMPS ABS, IGG: 20.8 AU/mL (ref >10.9)
RUBEOLA AB, IGG: 234 AU/mL (ref >16.4)
Rubella Antibodies, IGG: 10.4 index (ref >0.99)

## 2019-10-13 ENCOUNTER — Ambulatory Visit: Payer: BLUE CROSS/BLUE SHIELD

## 2019-12-25 ENCOUNTER — Other Ambulatory Visit: Payer: BC Managed Care – PPO

## 2019-12-25 ENCOUNTER — Other Ambulatory Visit: Payer: Self-pay

## 2019-12-25 DIAGNOSIS — Z0000 Encounter for general adult medical examination without abnormal findings: Secondary | ICD-10-CM

## 2019-12-26 LAB — CBC WITH DIFFERENTIAL/PLATELET
Basophils Absolute: 0.1 10*3/uL (ref 0.0–0.2)
Basos: 1 %
EOS (ABSOLUTE): 0.3 10*3/uL (ref 0.0–0.4)
Eos: 5 %
Hematocrit: 44.3 % (ref 37.5–51.0)
Hemoglobin: 15.2 g/dL (ref 13.0–17.7)
Immature Grans (Abs): 0 10*3/uL (ref 0.0–0.1)
Immature Granulocytes: 1 %
Lymphocytes Absolute: 1.7 10*3/uL (ref 0.7–3.1)
Lymphs: 32 %
MCH: 31.1 pg (ref 26.6–33.0)
MCHC: 34.3 g/dL (ref 31.5–35.7)
MCV: 91 fL (ref 79–97)
Monocytes Absolute: 0.5 10*3/uL (ref 0.1–0.9)
Monocytes: 9 %
Neutrophils Absolute: 2.8 10*3/uL (ref 1.4–7.0)
Neutrophils: 52 %
Platelets: 232 10*3/uL (ref 150–450)
RBC: 4.89 x10E6/uL (ref 4.14–5.80)
RDW: 12.7 % (ref 11.6–15.4)
WBC: 5.3 10*3/uL (ref 3.4–10.8)

## 2019-12-26 LAB — COMPREHENSIVE METABOLIC PANEL
ALT: 16 IU/L (ref 0–44)
AST: 18 IU/L (ref 0–40)
Albumin/Globulin Ratio: 2.1 (ref 1.2–2.2)
Albumin: 4.6 g/dL (ref 4.0–5.0)
Alkaline Phosphatase: 45 IU/L — ABNORMAL LOW (ref 48–121)
BUN/Creatinine Ratio: 16 (ref 9–20)
BUN: 16 mg/dL (ref 6–20)
Bilirubin Total: 0.6 mg/dL (ref 0.0–1.2)
CO2: 18 mmol/L — ABNORMAL LOW (ref 20–29)
Calcium: 9.3 mg/dL (ref 8.7–10.2)
Chloride: 106 mmol/L (ref 96–106)
Creatinine, Ser: 1.03 mg/dL (ref 0.76–1.27)
GFR calc Af Amer: 111 mL/min/{1.73_m2} (ref >59)
GFR calc non Af Amer: 96 mL/min/{1.73_m2} (ref >59)
Globulin, Total: 2.2 g/dL (ref 1.5–4.5)
Glucose: 90 mg/dL (ref 65–99)
Potassium: 4.4 mmol/L (ref 3.5–5.2)
Sodium: 140 mmol/L (ref 134–144)
Total Protein: 6.8 g/dL (ref 6.0–8.5)

## 2019-12-26 LAB — LIPID PANEL
Chol/HDL Ratio: 3 ratio (ref 0.0–5.0)
Cholesterol, Total: 133 mg/dL (ref 100–199)
HDL: 45 mg/dL (ref >39)
LDL Chol Calc (NIH): 72 mg/dL (ref 0–99)
Triglycerides: 84 mg/dL (ref 0–149)
VLDL Cholesterol Cal: 16 mg/dL (ref 5–40)

## 2019-12-26 LAB — URINALYSIS, ROUTINE W REFLEX MICROSCOPIC
Bilirubin, UA: NEGATIVE
Glucose, UA: NEGATIVE
Ketones, UA: NEGATIVE
Leukocytes,UA: NEGATIVE
Nitrite, UA: NEGATIVE
Protein,UA: NEGATIVE
RBC, UA: NEGATIVE
Specific Gravity, UA: 1.016 (ref 1.005–1.030)
Urobilinogen, Ur: 0.2 mg/dL (ref 0.2–1.0)
pH, UA: 5.5 (ref 5.0–7.5)

## 2020-03-06 ENCOUNTER — Other Ambulatory Visit: Payer: Self-pay | Admitting: Otolaryngology

## 2020-03-06 DIAGNOSIS — R1312 Dysphagia, oropharyngeal phase: Secondary | ICD-10-CM

## 2020-03-10 IMAGING — RF ESOPHAGUS/BARIUM SWALLOW/TABLET STUDY
10 of 14 series · 14 of 20 positions shown · non-contrast
Comparison: None.

CLINICAL DATA: Pt reports that he's had 2 episodes of passing out
over the past 2 years. Pt states that he started experiencing a
tingling sensation in his mid-sternum after eating and also at night
when he lays down.

EXAM:
UPPER GI SERIES WITHOUT KUB
ESOPHAGUS/BARIUM SWALLOW/TABLET STUDY
TECHNIQUE: Routine upper GI series and barium swallow was performed with
thin/high density/water soluble barium.
FLUOROSCOPY TIME:  Fluoroscopy Time:  0.7 minute
Radiation Exposure Index (if provided by the fluoroscopic device):
4.1 mGy
Number of Acquired Spot Images: 0

[Series 1: cp_standard · 0.25mm/px · 3 of 17 frames shown (1 of 10)]
[frame 1/17]
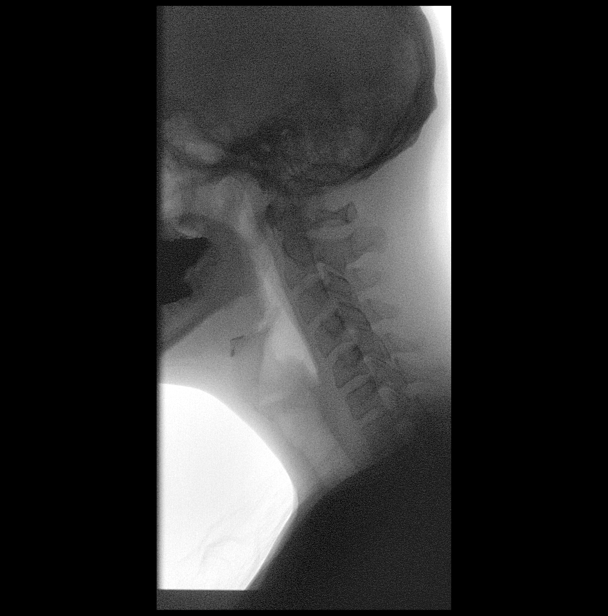
[frame 9/17]
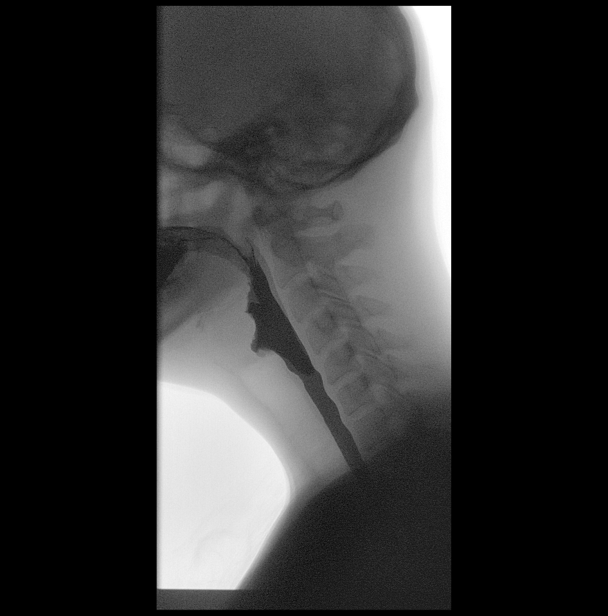
[frame 15/17]
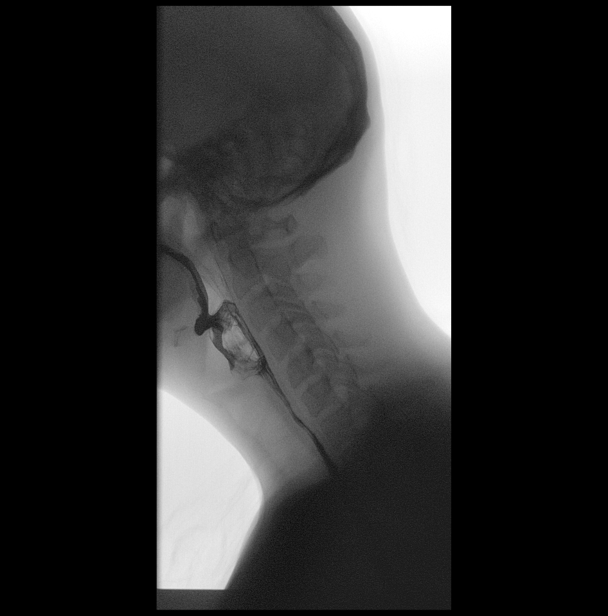

[Series 2: cp_standard · 0.25mm/px · 3 of 37 frames shown (2 of 10)]
[frame 9/37]
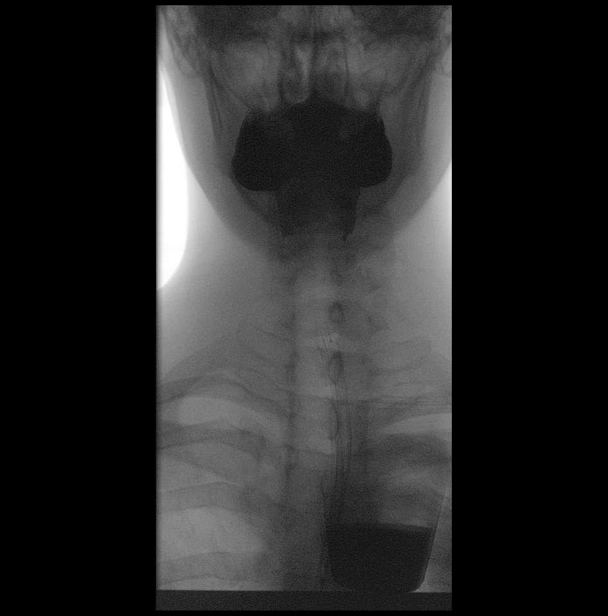
[frame 19/37]
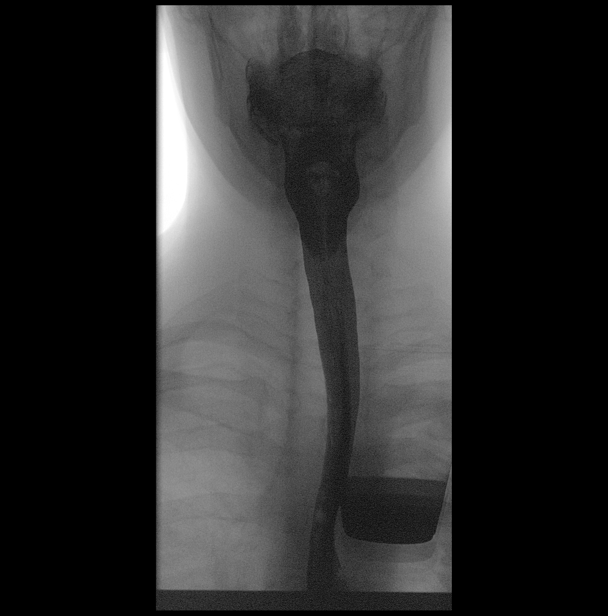
[frame 32/37]
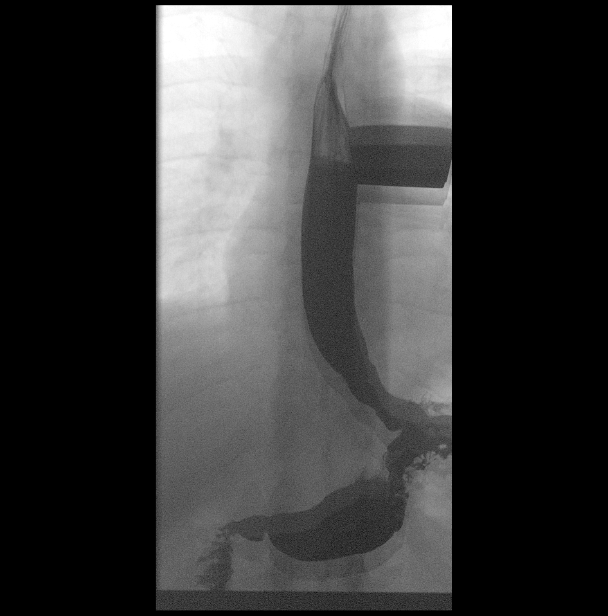

[Series 4: cp_standard · 0.25mm/px · 1 of 1 slices shown (3 of 10)]
[im 1/1]
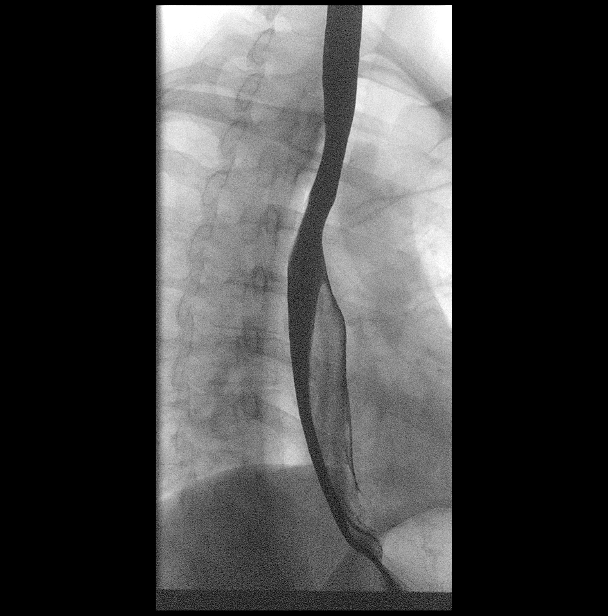

[Series 5: cp_standard · 0.25mm/px · 1 of 1 slices shown (4 of 10)]
[im 1/1]
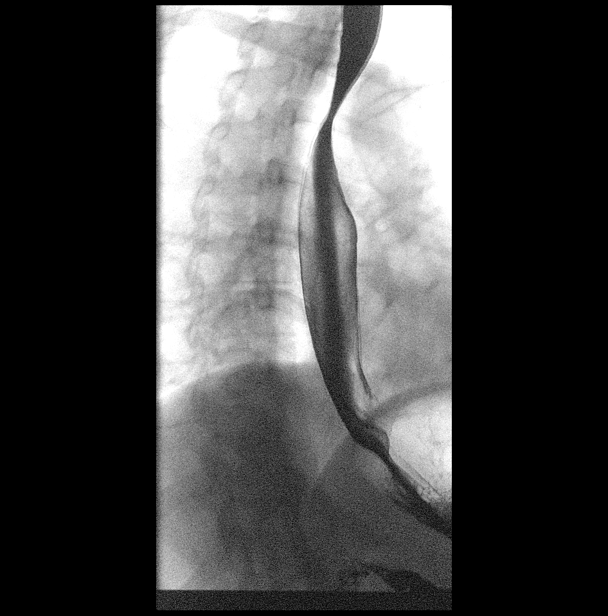

[Series 7: cp_standard · 0.28mm/px · 1 of 1 slices shown (5 of 10)]
[im 1/1]
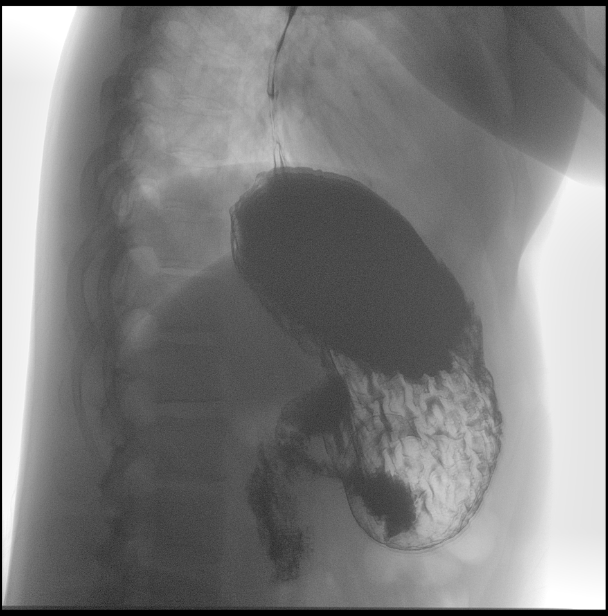

[Series 8: cp_standard · 0.28mm/px · 1 of 1 slices shown (6 of 10)]
[im 1/1]
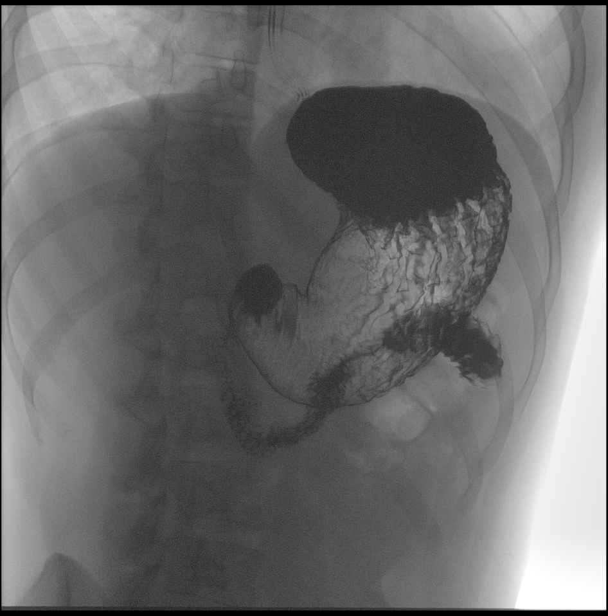

[Series 10: cp_standard · 0.29mm/px · 1 of 1 slices shown (7 of 10)]
[im 1/1]
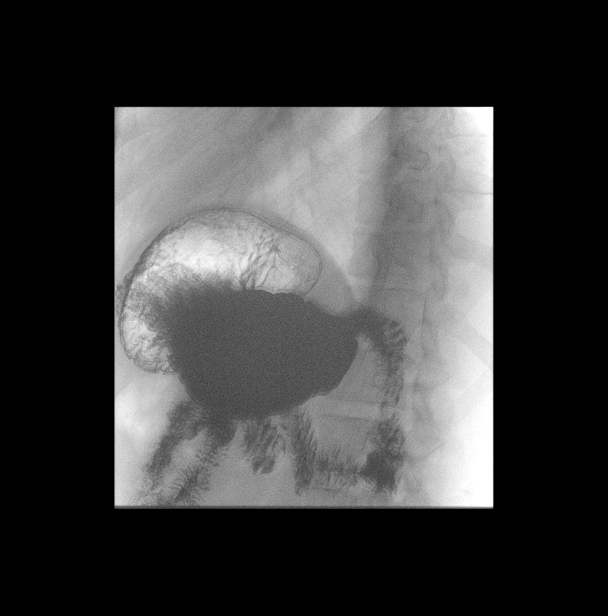

[Series 11: cp_standard · 0.30mm/px · 1 of 1 slices shown (8 of 10)]
[im 1/1]
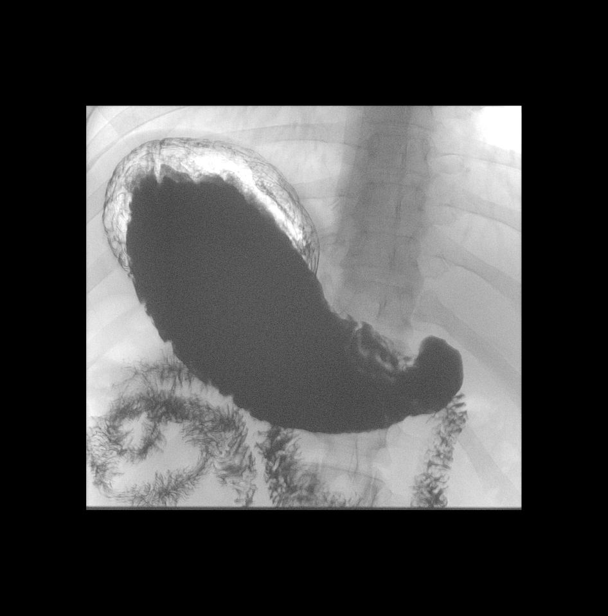

[Series 12: cp_standard · 0.30mm/px · 1 of 1 slices shown (9 of 10)]
[im 1/1]
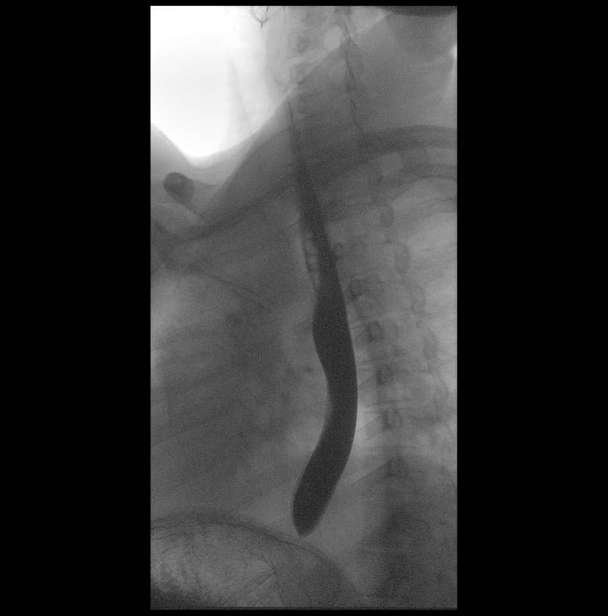

[Series 14: cp_standard · 0.30mm/px · 1 of 1 slices shown (10 of 10)]
[im 1/1]
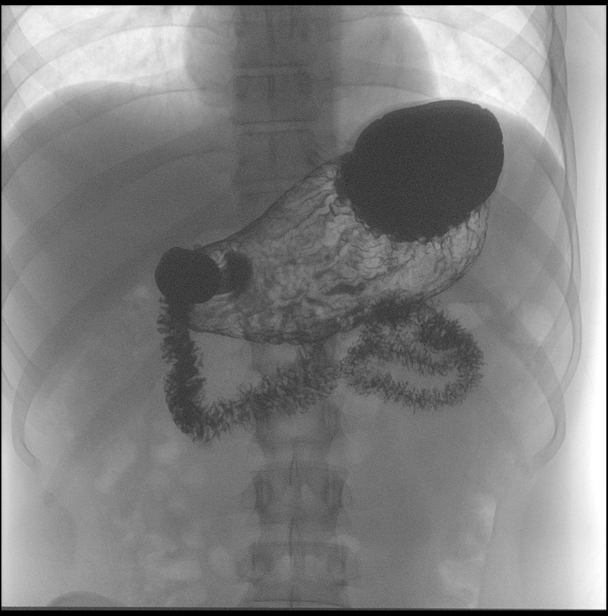

[14 of 20 positions shown; findings below may reference images not displayed]

FINDINGS: There was normal pharyngeal anatomy and motility. Contrast flowed
freely through the esophagus without evidence of stricture or mass.
There was normal esophageal mucosa without evidence of irregularity
or ulceration. Esophageal motility was normal. Moderate
gastroesophageal reflux. No definite hiatal hernia was demonstrated.

Examination of the stomach demonstrated normal rugal folds and areae
gastricae. The gastric mucosa appeared unremarkable without evidence
of ulceration, scarring, or mass lesion. Gastric motility and
emptying was normal. Fluoroscopic examination of the duodenum
demonstrates normal motility and morphology without evidence of
ulceration or mass lesion.

At the end of the examination a 13 mm barium tablet was administered
which transited through the esophagus and esophagogastric junction
without delay.
IMPRESSION: 1. Moderate gastroesophageal reflux.
2. Otherwise normal barium swallow and upper GI.

## 2020-03-17 ENCOUNTER — Ambulatory Visit: Payer: Self-pay

## 2020-06-19 ENCOUNTER — Telehealth: Payer: Self-pay

## 2020-06-19 NOTE — Telephone Encounter (Signed)
Patient LVM this morning stating he had an EGD scheduled with our office but was unsure of the location.  I returned patients phone call, left voice message informing him we did not have him down for any procedures with our physicians.  Thanks,  [place], [place]

## 2021-02-03 ENCOUNTER — Other Ambulatory Visit
Admission: RE | Admit: 2021-02-03 | Discharge: 2021-02-03 | Disposition: A | Discharge status: Home / Self Care | Payer: Self-pay | Source: Ambulatory Visit | Attending: Pulmonary Disease | Admitting: Pulmonary Disease

## 2021-02-03 DIAGNOSIS — R55 Syncope and collapse: Secondary | ICD-10-CM | POA: Insufficient documentation

## 2021-02-03 LAB — D-DIMER, QUANTITATIVE: D-Dimer, Quant: <0.27 ug/mL-FEU (ref 0.00–0.50)

## 2021-03-16 ENCOUNTER — Ambulatory Visit: Payer: Self-pay | Admitting: Cardiology

## 2021-09-10 ENCOUNTER — Encounter: Payer: Self-pay | Admitting: Nurse Practitioner

## 2021-09-10 ENCOUNTER — Ambulatory Visit: Payer: BC Managed Care – PPO | Admitting: Nurse Practitioner

## 2021-09-10 ENCOUNTER — Other Ambulatory Visit: Payer: Self-pay

## 2021-09-10 VITALS — BP 112/70 | HR 65 | Temp 98.5°F | Resp 16

## 2021-09-10 DIAGNOSIS — B084 Enteroviral vesicular stomatitis with exanthem: Secondary | ICD-10-CM

## 2021-09-10 DIAGNOSIS — J069 Acute upper respiratory infection, unspecified: Secondary | ICD-10-CM

## 2021-09-10 NOTE — Progress Notes (Signed)
° °  Subjective:    Patient ID: Jerry Santana, male    DOB: 06-11-1986, 36 y.o.   MRN: [id number]  HPI Jerry Santana is a 36 year old caucasian male that presents to the clinic with a sore throat x 2 days and new onset of red rash on anterior and palmar aspects of hands bilaterally. States that hands do itch a little and burn. He denies headache, fever, body aches. Denies recent exposure to Covid or flu. His daughter attends daycare and was diagnosed with strep throat early last week; she has also developed a similar rash on her hands.  He is vaccinated and boosted against Covid. No flu vaccine this season.   Review of Systems  Constitutional: Negative.   HENT:  Positive for postnasal drip and sore throat. Negative for congestion, rhinorrhea, sinus pressure and sinus pain.   Respiratory: Negative.    Cardiovascular: Negative.   Skin:  Positive for rash.       Red spots on hands  Allergic/Immunologic: Negative.   Neurological: Negative.       Vitals:   09/10/21 1640  BP: 112/70  Pulse: 65  Resp: 16  Temp: 98.5 F (36.9 C)  SpO2: 96%    Objective:   Physical Exam Vitals reviewed.  Constitutional:      General: He is not in acute distress.    Appearance: Normal appearance. He is normal weight. He is not ill-appearing.  HENT:     Head: Normocephalic and atraumatic.     Right Ear: Tympanic membrane, ear canal and external ear normal.     Left Ear: Tympanic membrane, ear canal and external ear normal.     Nose: Congestion present. No rhinorrhea.     Mouth/Throat:     Mouth: Mucous membranes are moist.     Pharynx: Oropharynx is clear. No oropharyngeal exudate or posterior oropharyngeal erythema.  Eyes:     Conjunctiva/sclera: Conjunctivae normal.  Cardiovascular:     Rate and Rhythm: Normal rate and regular rhythm.  Pulmonary:     Effort: Pulmonary effort is normal.     Breath sounds: Normal breath sounds. No wheezing, rhonchi or rales.  Musculoskeletal:        General: Normal  range of motion.     Cervical back: Normal range of motion.  Skin:    General: Skin is warm and dry.     Findings: Rash present.     Comments: Multiple, pinpoint red spots on anterior and palmar aspects of hands bilaterally. Similar rash noted between toes on right foot.  Neurological:     General: No focal deficit present.     Mental Status: He is alert and oriented to person, place, and time.  Psychiatric:        Mood and Affect: Mood normal.        Behavior: Behavior normal.        Thought Content: Thought content normal.        Judgment: Judgment normal.          Assessment & Plan:  1. Hand, foot and mouth disease Instructed to take Friday off from work and to call clinic on Monday to update on status of rash. If rash is improving he may return to work on Monday 2/13.  2. Viral upper respiratory tract infection Encouraged to rest and increase fluids. May take OTC cold/sinus medication for URI symptoms.

## 2022-01-19 ENCOUNTER — Encounter: Payer: Self-pay | Admitting: Medical

## 2022-01-19 ENCOUNTER — Ambulatory Visit: Payer: BC Managed Care – PPO | Admitting: Medical

## 2022-01-19 VITALS — BP 100/68 | HR 55 | Temp 97.4°F | Resp 14

## 2022-01-19 DIAGNOSIS — J029 Acute pharyngitis, unspecified: Secondary | ICD-10-CM

## 2022-01-19 DIAGNOSIS — J020 Streptococcal pharyngitis: Secondary | ICD-10-CM

## 2022-01-19 LAB — POCT RAPID STREP A (OFFICE): Rapid Strep A Screen: POSITIVE — AB

## 2022-01-19 MED ORDER — AMOXICILLIN-POT CLAVULANATE 875-125 MG PO TABS: 1.0000 | ORAL_TABLET | Freq: Two times a day (BID) | ORAL | 0 refills | Status: DC

## 2022-01-19 NOTE — Progress Notes (Signed)
   Subjective:    Patient ID: Jerry Santana, male    DOB: 05-23-1986, 37 y.o.   MRN: [id number]  HPI 36 yo male in non acute distress. Presents today with symptoms of sore throat. Started last Thursday. Also with nasal congestion and post nasal drip all clear.   Blood pressure 100/68, pulse (!) 55, temperature (!) 97.4 F (36.3 C), temperature source Tympanic, resp. rate 14, SpO2 95 %.   Review of Systems  Constitutional:  Positive for fatigue. Negative for chills and fever.  HENT:  Positive for congestion, ear pain, postnasal drip, rhinorrhea, sneezing, sore throat and trouble swallowing. Negative for ear discharge, sinus pressure, sinus pain and voice change.   Eyes:  Negative for discharge and redness.  Respiratory:  Negative for cough, chest tightness, shortness of breath and wheezing.   Cardiovascular:  Negative for chest pain.  Gastrointestinal:  Positive for nausea. Negative for diarrhea and vomiting.  Genitourinary:  Negative for dysuria.  Musculoskeletal:  Negative for myalgias.  Skin:  Negative for color change and rash.  Allergic/Immunologic: Positive for environmental allergies.  Neurological:  Negative for dizziness and headaches.   Takes Flonase and Zyrtec daily    Objective:   Physical Exam Constitutional:      Appearance: Normal appearance.  HENT:     Head: Normocephalic and atraumatic.     Right Ear: There is impacted cerumen.     Left Ear: Ear canal and external ear normal.     Ears:     Comments: TM dark on left side, no erythema Neurological:     Mental Status: He is alert.    Results for orders placed or performed in visit on 01/19/22 (from the past 24 hour(s))  POCT rapid strep A     Status: Abnormal   Collection Time: 01/19/22 10:20 AM  Result Value Ref Range   Rapid Strep A Screen Positive (A) Negative     Strep test positive    Assessment & Plan:  Strep pharyngitis Meds ordered this encounter  Medications   amoxicillin-clavulanate  (AUGMENTIN) 875-125 MG tablet    Sig: Take 1 tablet by mouth 2 (two) times daily.    Dispense:  20 tablet    Refill:  0   Soft foods, aboid tomatores and orange juice/lemons/limes. Return in 3-5 days if symptoms are not improving. Patient verbalizes understanding nd has no questions at discharge.

## 2022-01-19 NOTE — Patient Instructions (Signed)

## 2022-04-26 ENCOUNTER — Encounter: Payer: Self-pay | Admitting: Physician Assistant

## 2022-04-26 ENCOUNTER — Ambulatory Visit (INDEPENDENT_AMBULATORY_CARE_PROVIDER_SITE_OTHER): Payer: Self-pay | Admitting: Physician Assistant

## 2022-04-26 VITALS — BP 120/80 | HR 76 | Temp 97.7°F | Ht 67.0 in | Wt 171.6 lb

## 2022-04-26 DIAGNOSIS — J029 Acute pharyngitis, unspecified: Secondary | ICD-10-CM

## 2022-04-26 DIAGNOSIS — Z20818 Contact with and (suspected) exposure to other bacterial communicable diseases: Secondary | ICD-10-CM

## 2022-04-26 LAB — POCT RAPID STREP A (OFFICE): Rapid Strep A Screen: NEGATIVE

## 2022-04-26 NOTE — Progress Notes (Signed)
[facility] [address] [place], [place] [address]   Office Visit Note  Patient Name: Jerry Santana Date of Birth [id number]  Medical Record number [id number]  Date of Service: 04/26/2022  Chief Complaint  Patient presents with   Sore Throat    Kid tested Pos for strep last week. ST started a couple days ago. Getting worse. Painful more on right side.      36 y/o M presents to the clinic with sore throat x 2 days. +known exposure to strep. C/o painful swallowing. R>L. No fever.Pain is  getting worse. +post nasal drainage.  Sore Throat  Associated symptoms include trouble swallowing. Pertinent negatives include no congestion.      Current Medication:  Outpatient Encounter Medications as of 04/26/2022  Medication Sig   Budeson-Glycopyrrol-Formoterol (BREZTRI AEROSPHERE) 160-9-4.8 MCG/ACT AERO    EPINEPHrine (AUVI-Q) 0.3 mg/0.3 mL IJ SOAJ injection See admin instructions.   finasteride (PROSCAR) 5 MG tablet Take 5 mg by mouth daily.    pantoprazole (PROTONIX) 20 MG tablet    esomeprazole (NEXIUM) 40 MG capsule Take 1 capsule by mouth daily. (Patient not taking: Reported on 04/26/2022)   SYMBICORT 160-4.5 MCG/ACT inhaler Inhale 2 puffs into the lungs 2 (two) times a day. (Patient not taking: Reported on 04/26/2022)   [DISCONTINUED] amoxicillin-clavulanate (AUGMENTIN) 875-125 MG tablet Take 1 tablet by mouth 2 (two) times daily. (Patient not taking: Reported on 04/26/2022)   No facility-administered encounter medications on file as of 04/26/2022.      Medical History: Past Medical History:  Diagnosis Date   Asthma    Depression      Vital Signs: BP 120/80 (BP Location: Left Arm, Patient Position: Sitting, Cuff Size: Normal)   Pulse 76   Temp 97.7 F (36.5 C) (Tympanic)   Ht 5\' 7"  (1.702 m)   Wt 171 lb 9.6 oz (77.8 kg)   SpO2 98%   BMI 26.88 kg/m    Review of Systems  Constitutional: Negative.   HENT:  Positive for postnasal drip, sore throat and  trouble swallowing. Negative for congestion, sinus pressure and sinus pain.   Respiratory: Negative.    Cardiovascular: Negative.   Neurological: Negative.     Physical Exam Constitutional:      Appearance: Normal appearance.  HENT:     Head: Atraumatic.     Right Ear: Tympanic membrane, ear canal and external ear normal.     Left Ear: Tympanic membrane, ear canal and external ear normal.     Nose: Nose normal.     Mouth/Throat:     Mouth: Mucous membranes are moist.     Pharynx: Oropharynx is clear. No pharyngeal swelling or oropharyngeal exudate.     Tonsils: No tonsillar exudate or tonsillar abscesses.  Eyes:     Extraocular Movements: Extraocular movements intact.  Cardiovascular:     Rate and Rhythm: Normal rate and regular rhythm.  Pulmonary:     Effort: Pulmonary effort is normal.     Breath sounds: Normal breath sounds.  Musculoskeletal:     Cervical back: Neck supple.  Skin:    General: Skin is warm.  Neurological:     Mental Status: He is alert.  Psychiatric:        Mood and Affect: Mood normal.        Behavior: Behavior normal.        Thought Content: Thought content normal.        Judgment: Judgment normal.       Assessment/Plan:  ICD-10-CM   1. Sore throat  J02.9 POCT rapid strep A    2. Strep throat exposure  Z20.818       Reviewed negative strep test with patient. He verbalized understanding. Stay well hydrated.  Drink warm liquids or eat soft ice cream to help soothe the throat.  Take otc oral anti-histamine as directed on the bottle.  Take throat logenzes to help with sore throat. May consider otc Ibuprofen or Tylenol for pain if needed. RTC to clinic for worsening symptoms or if symptoms don't improve. Pt verbalized understanding and in agreement.    General Counseling: Miklo verbalizes understanding of the findings of todays visit and agrees with plan of treatment. I have discussed any further diagnostic evaluation that may be needed or  ordered today. We also reviewed his medications today. he has been encouraged to call the office with any questions or concerns that should arise related to todays visit.    Time spent:25 [place], [place] Physician Assistant

## 2023-01-05 ENCOUNTER — Ambulatory Visit: Payer: BC Managed Care – PPO | Admitting: Urology

## 2023-01-05 ENCOUNTER — Encounter: Payer: Self-pay | Admitting: Urology

## 2023-01-05 VITALS — BP 113/75 | HR 56 | Ht 70.0 in | Wt 165.0 lb

## 2023-01-05 DIAGNOSIS — R3989 Other symptoms and signs involving the genitourinary system: Secondary | ICD-10-CM | POA: Diagnosis not present

## 2023-01-05 DIAGNOSIS — R39198 Other difficulties with micturition: Secondary | ICD-10-CM

## 2023-01-05 DIAGNOSIS — R300 Dysuria: Secondary | ICD-10-CM

## 2023-01-05 DIAGNOSIS — R399 Unspecified symptoms and signs involving the genitourinary system: Secondary | ICD-10-CM

## 2023-01-05 LAB — BLADDER SCAN AMB NON-IMAGING: Scan Result: 0

## 2023-01-05 NOTE — Progress Notes (Signed)
   I, Duke Salvia, acting as a Neurosurgeon for [name], MD., have documented all relevant documentation on the behalf of [name], MD, as directed by  [name], MD while in the presence of [name], MD.   01/05/2023 12:13 PM   Jerry Santana [birth year] [id number]  Referring provider: Jerl Mina, MD [address] [facility] [place],  [place] [address]  Chief Complaint  Patient presents with   New Patient (Initial Visit)    HPI: Jerry Santana is a 37 y.o. male referred for evaluation of a slow urinary stream.  Intermittent mild symptoms, which he rates 0-1 on a bother scale, including occasional sensation of incomplete emptying, nocturia x1, and occasional tingling sensation when voiding. No gross hematuria, flank, or abdominal pain.   PMH: Past Medical History:  Diagnosis Date   Asthma    Depression     Surgical History: History reviewed. No pertinent surgical history.  Home Medications:  Allergies as of 01/05/2023   No Known Allergies      Medication List        Accurate as of January 05, 2023 12:13 PM. If you have any questions, ask your nurse or doctor.          STOP taking these medications    Breztri Aerosphere 160-9-4.8 MCG/ACT Aero Generic drug: Budeson-Glycopyrrol-Formoterol Stopped by: [name], MD   esomeprazole 40 MG capsule Commonly known as: NEXIUM Stopped by: [name], MD   Symbicort 160-4.5 MCG/ACT inhaler Generic drug: budesonide-formoterol Stopped by: [name], MD       TAKE these medications    Auvi-Q 0.3 mg/0.3 mL Soaj injection Generic drug: EPINEPHrine See admin instructions.   finasteride 5 MG tablet Commonly known as: PROSCAR Take 5 mg by mouth daily.   pantoprazole 20 MG tablet Commonly known as: PROTONIX        Social History:  reports that he has never smoked. He has never used smokeless tobacco. He reports current alcohol use. He reports that he does not  use drugs.   Physical Exam: BP 113/75   Pulse (!) 56   Ht 5\' 10"  (1.778 m)   Wt 165 lb (74.8 kg)   BMI 23.68 kg/m   Constitutional:  Alert and oriented, No acute distress. HEENT: [place] AT Respiratory: Normal respiratory effort, no increased work of breathing. GU: Phallus uncircumcised. Erythema, yet is normal in appearance. Testes descended bilaterally without masses or tenderness Psychiatric: Normal mood and affect.  Laboratory Data:  Urinalysis Dipstick/microscopy negative.   Assessment & Plan:   1. Lower urinary tract symptoms Mild LUTS. We discussed most likely secondary to increased bladder neck tone, which can be triggered by dietary factors or stress. We also discussed possibility of non-bacterial prostatitis. Discussed an alpha blocker trial to take when symptomatic, however, currently his symptoms are not bothersome enough that he desires medication. PVR 19 mL. Patient reassured with normal urinalysis and no significant PVR there is a low likelyhood of any significant urologic problems such as urethral stricture. He will follow up as needed.   I have reviewed the above documentation for accuracy and completeness, and I agree with the above.   [name], MD  [facility] [address] [place], [place] [address] [phone]

## 2023-01-06 LAB — URINALYSIS, COMPLETE
Bilirubin, UA: NEGATIVE
Glucose, UA: NEGATIVE
Ketones, UA: NEGATIVE
Leukocytes,UA: NEGATIVE
Nitrite, UA: NEGATIVE
Protein,UA: NEGATIVE
RBC, UA: NEGATIVE
Specific Gravity, UA: 1.02 (ref 1.005–1.030)
Urobilinogen, Ur: 0.2 mg/dL (ref 0.2–1.0)
pH, UA: 5.5 (ref 5.0–7.5)

## 2023-01-06 LAB — MICROSCOPIC EXAMINATION: Bacteria, UA: NONE SEEN

## 2023-10-20 ENCOUNTER — Encounter: Payer: Self-pay | Admitting: Adult Health

## 2023-10-20 ENCOUNTER — Other Ambulatory Visit: Payer: Self-pay

## 2023-10-20 ENCOUNTER — Ambulatory Visit (INDEPENDENT_AMBULATORY_CARE_PROVIDER_SITE_OTHER): Payer: Self-pay | Admitting: Adult Health

## 2023-10-20 VITALS — BP 118/92 | HR 62 | Temp 96.3°F | Ht 67.0 in | Wt 165.0 lb

## 2023-10-20 DIAGNOSIS — J029 Acute pharyngitis, unspecified: Secondary | ICD-10-CM

## 2023-10-20 LAB — POCT RAPID STREP A (OFFICE): Rapid Strep A Screen: NEGATIVE

## 2023-10-20 MED ORDER — AMOXICILLIN-POT CLAVULANATE 875-125 MG PO TABS: 1.0000 | ORAL_TABLET | Freq: Two times a day (BID) | ORAL | 0 refills | Status: AC

## 2023-10-20 NOTE — Progress Notes (Signed)
 [facility] [address] [place], [place] [address]   Office Visit Note  Patient Name: Jerry Santana Date of Birth [id number]  Medical Record number [id number]  Date of Service: 10/20/2023  Chief Complaint  Patient presents with   Sore Throat    Patient c/o sore throat. Denies fever. Symptoms began 4-5 days ago. He used an OTC throat numbing spray which offers short-term relief.      Sore Throat    Pt is here for a sick visit. He reports 4-5 days of sore throat. It has become a little worse each day.  He also was feeling feverish, headache  and ear pressure. He has a wife and two kids, and none of them are sick yet. He has taken some flonase, zyrtec, and other medications.     Current Medication:  Outpatient Encounter Medications as of 10/20/2023  Medication Sig   albuterol (VENTOLIN HFA) 108 (90 Base) MCG/ACT inhaler Inhale into the lungs every 4 (four) hours as needed for wheezing.   cetirizine (ZYRTEC) 10 MG chewable tablet Chew 10 mg by mouth daily as needed.   EPINEPHrine (AUVI-Q) 0.3 mg/0.3 mL IJ SOAJ injection See admin instructions.   finasteride (PROSCAR) 5 MG tablet Take 1 mg by mouth daily.   fluticasone (FLONASE ALLERGY RELIEF) 50 MCG/ACT nasal spray Place 1 spray into both nostrils daily.   pantoprazole (PROTONIX) 20 MG tablet  (Patient not taking: Reported on 10/20/2023)   No facility-administered encounter medications on file as of 10/20/2023.      Medical History: Past Medical History:  Diagnosis Date   Asthma    Depression      Vital Signs: BP (!) 118/92   Pulse 62   Temp (!) 96.3 F (35.7 C)   Ht 5\' 7"  (1.702 m)   Wt 165 lb (74.8 kg)   SpO2 97%   BMI 25.84 kg/m    Review of Systems  Constitutional:  Positive for fever. Negative for chills and fatigue.  HENT:  Positive for sore throat.   Eyes:  Negative for pain and itching.    Physical Exam Vitals reviewed.  Constitutional:      Appearance: Normal appearance.  HENT:      Head: Normocephalic.     Right Ear: Tympanic membrane and ear canal normal.     Left Ear: Tympanic membrane and ear canal normal.     Nose: Nose normal.     Mouth/Throat:     Mouth: Mucous membranes are moist.     Pharynx: No posterior oropharyngeal erythema.  Eyes:     Pupils: Pupils are equal, round, and reactive to light.  Cardiovascular:     Rate and Rhythm: Normal rate.  Pulmonary:     Effort: Pulmonary effort is normal.     Breath sounds: Normal breath sounds.  Lymphadenopathy:     Cervical: Cervical adenopathy (shotty anterior) present.  Neurological:     Mental Status: He is alert.    Assessment/Plan: 1. Pharyngitis, unspecified etiology (Primary) Finish all antibiotics as prescribed, even when you are feeling better.  TAke antibiotic with food.  Take an over-the-counter pain reliever/anti-inflammatory (such as ibuprofen) to help relieve pain or fever.  Rest and drink plenty of water.  Drinking warm or cold liquids (such as tea, soup or smoothies) and eating soft foods (such as oatmeal) may be more comfortable until your throat pain improves.  Do not share cups/water bottles/ utensils with others.  Wash your hands or use hand sanitizer often, especially  before/after eating and after coughing into your hand or blowing your nose.  Send secure message to provider or schedule return appointment as needed for new/worsening symptoms (such as increased throat pain) or if your symptoms are not improving after 2-3 days on the antibiotic.    - amoxicillin-clavulanate (AUGMENTIN) 875-125 MG tablet; Take 1 tablet by mouth 2 (two) times daily.  Dispense: 20 tablet; Refill: 0  2. Sore throat - POCT rapid strep A        General Counseling: Nathaneil verbalizes understanding of the findings of todays visit and agrees with plan of treatment. I have discussed any further diagnostic evaluation that may be needed or ordered today. We also reviewed his medications today. he has been encouraged to call  the office with any questions or concerns that should arise related to todays visit.   Orders Placed This Encounter  Procedures   POCT rapid strep A    No orders of the defined types were placed in this encounter.   Time spent:15 Minutes    Johnna Acosta AGNP-C Nurse Practitioner

## 2023-11-07 ENCOUNTER — Other Ambulatory Visit: Payer: Self-pay | Admitting: Oncology

## 2023-11-07 MED ORDER — OSELTAMIVIR PHOSPHATE 75 MG PO CAPS: 75.0000 mg | ORAL_CAPSULE | Freq: Two times a day (BID) | ORAL | 0 refills | Status: AC
